# Patient Record
Sex: Female | Born: 1985 | Race: Black or African American | Hispanic: No | Marital: Single | Smoking: Former smoker
Health system: Southern US, Community
[De-identification: ages and names within clinical notes are randomized; demographics above are authoritative.]

## PROBLEM LIST (undated history)

## (undated) ENCOUNTER — Inpatient Hospital Stay (HOSPITAL_COMMUNITY): Payer: Self-pay

## (undated) DIAGNOSIS — D649 Anemia, unspecified: Secondary | ICD-10-CM

## (undated) DIAGNOSIS — Z87891 Personal history of nicotine dependence: Secondary | ICD-10-CM

## (undated) DIAGNOSIS — N189 Chronic kidney disease, unspecified: Secondary | ICD-10-CM

## (undated) DIAGNOSIS — O24419 Gestational diabetes mellitus in pregnancy, unspecified control: Secondary | ICD-10-CM

## (undated) DIAGNOSIS — E282 Polycystic ovarian syndrome: Secondary | ICD-10-CM

## (undated) HISTORY — PX: NO PAST SURGERIES: SHX2092

## (undated) HISTORY — PX: DILATION AND CURETTAGE OF UTERUS: SHX78

---

## 2005-11-14 ENCOUNTER — Emergency Department (HOSPITAL_COMMUNITY): Admission: EM | Admit: 2005-11-14 | Discharge: 2005-11-15 | Payer: Self-pay | Admitting: Emergency Medicine

## 2006-05-05 ENCOUNTER — Emergency Department (HOSPITAL_COMMUNITY): Admission: EM | Admit: 2006-05-05 | Discharge: 2006-05-05 | Payer: Self-pay | Admitting: Emergency Medicine

## 2007-08-28 ENCOUNTER — Emergency Department (HOSPITAL_COMMUNITY): Admission: EM | Admit: 2007-08-28 | Discharge: 2007-08-28 | Payer: Self-pay | Admitting: Emergency Medicine

## 2008-07-29 ENCOUNTER — Encounter: Admission: RE | Admit: 2008-07-29 | Discharge: 2008-07-29 | Payer: Self-pay | Admitting: Family Medicine

## 2009-05-20 ENCOUNTER — Emergency Department (HOSPITAL_COMMUNITY): Admission: EM | Admit: 2009-05-20 | Discharge: 2009-05-20 | Payer: Self-pay | Admitting: Emergency Medicine

## 2010-03-26 ENCOUNTER — Encounter: Payer: Self-pay | Admitting: Family Medicine

## 2010-05-11 ENCOUNTER — Emergency Department (HOSPITAL_COMMUNITY)
Admission: EM | Admit: 2010-05-11 | Discharge: 2010-05-11 | Disposition: A | Discharge status: Home / Self Care | Payer: Medicaid Other | Attending: Emergency Medicine | Admitting: Emergency Medicine

## 2010-05-11 DIAGNOSIS — N898 Other specified noninflammatory disorders of vagina: Secondary | ICD-10-CM | POA: Insufficient documentation

## 2010-05-11 DIAGNOSIS — R109 Unspecified abdominal pain: Secondary | ICD-10-CM | POA: Insufficient documentation

## 2010-05-11 LAB — CBC
MCH: 18.1 pg — ABNORMAL LOW (ref 26.0–34.0)
MCHC: 28.1 g/dL — ABNORMAL LOW (ref 30.0–36.0)
MCV: 64.3 fL — ABNORMAL LOW (ref 78.0–100.0)
Platelets: 241 10*3/uL (ref 150–400)
RBC: 4.7 MIL/uL (ref 3.87–5.11)
RDW: 22 % — ABNORMAL HIGH (ref 11.5–15.5)

## 2010-05-11 LAB — URINALYSIS, ROUTINE W REFLEX MICROSCOPIC
Bilirubin Urine: NEGATIVE
Ketones, ur: NEGATIVE mg/dL
Leukocytes, UA: NEGATIVE
Nitrite: NEGATIVE
Protein, ur: NEGATIVE mg/dL
Urobilinogen, UA: 0.2 mg/dL (ref 0.0–1.0)

## 2010-05-11 LAB — WET PREP, GENITAL
Clue Cells Wet Prep HPF POC: NONE SEEN
Trich, Wet Prep: NONE SEEN
WBC, Wet Prep HPF POC: NONE SEEN
Yeast Wet Prep HPF POC: NONE SEEN

## 2010-05-11 LAB — URINE MICROSCOPIC-ADD ON

## 2010-05-15 LAB — GC/CHLAMYDIA PROBE AMP, GENITAL

## 2010-05-27 LAB — URINE MICROSCOPIC-ADD ON

## 2010-05-27 LAB — URINALYSIS, ROUTINE W REFLEX MICROSCOPIC
Bilirubin Urine: NEGATIVE
Glucose, UA: NEGATIVE mg/dL
Protein, ur: 100 mg/dL — AB
Urobilinogen, UA: 1 mg/dL (ref 0.0–1.0)

## 2010-05-30 IMAGING — CR DG CHEST 2V
2 series · 2 of 2 positions shown · non-contrast
Comparison: None.

CLINICAL DATA: 22-year-old female with cough and fever.

CHEST - 2 VIEW

[w chest pa]
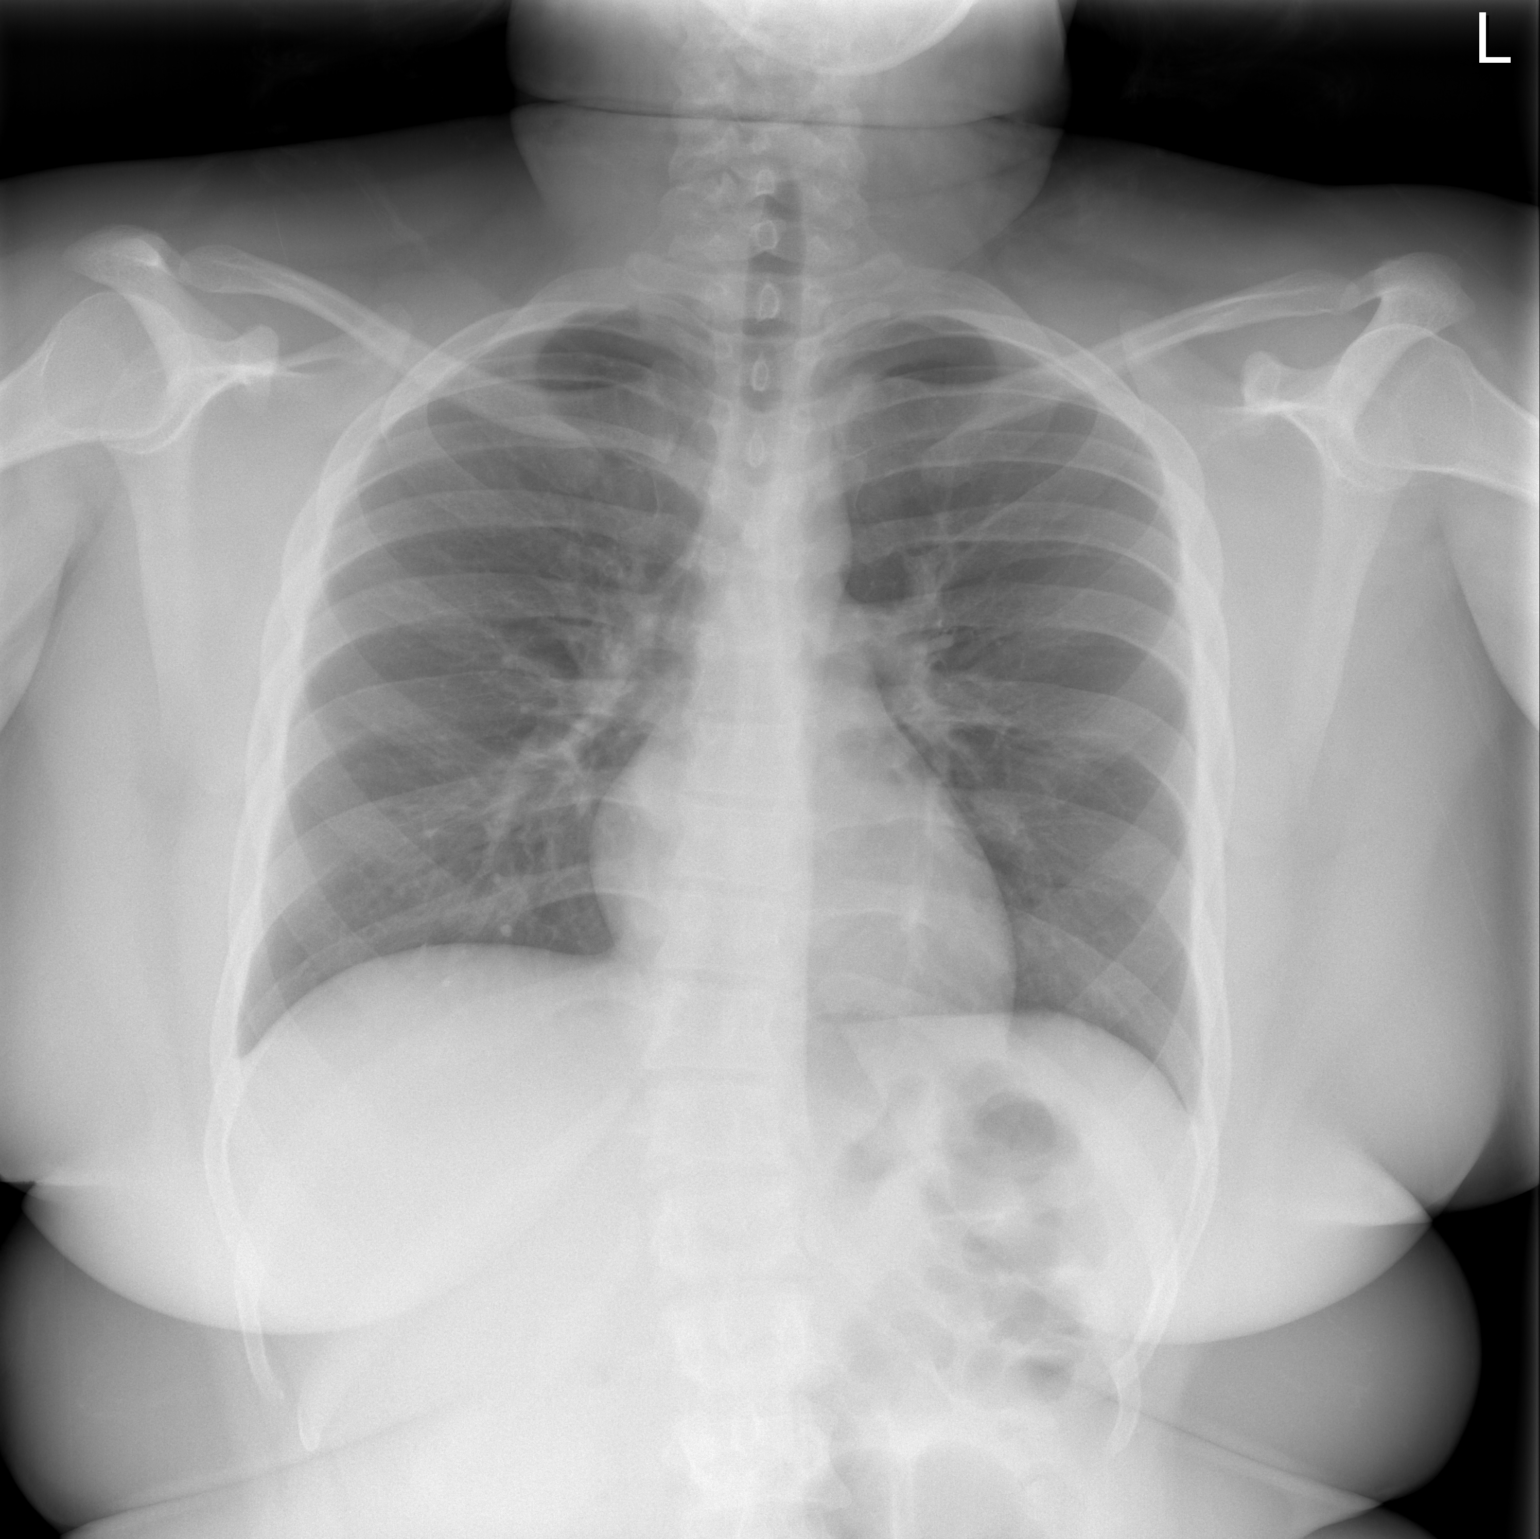

[w chest lat]
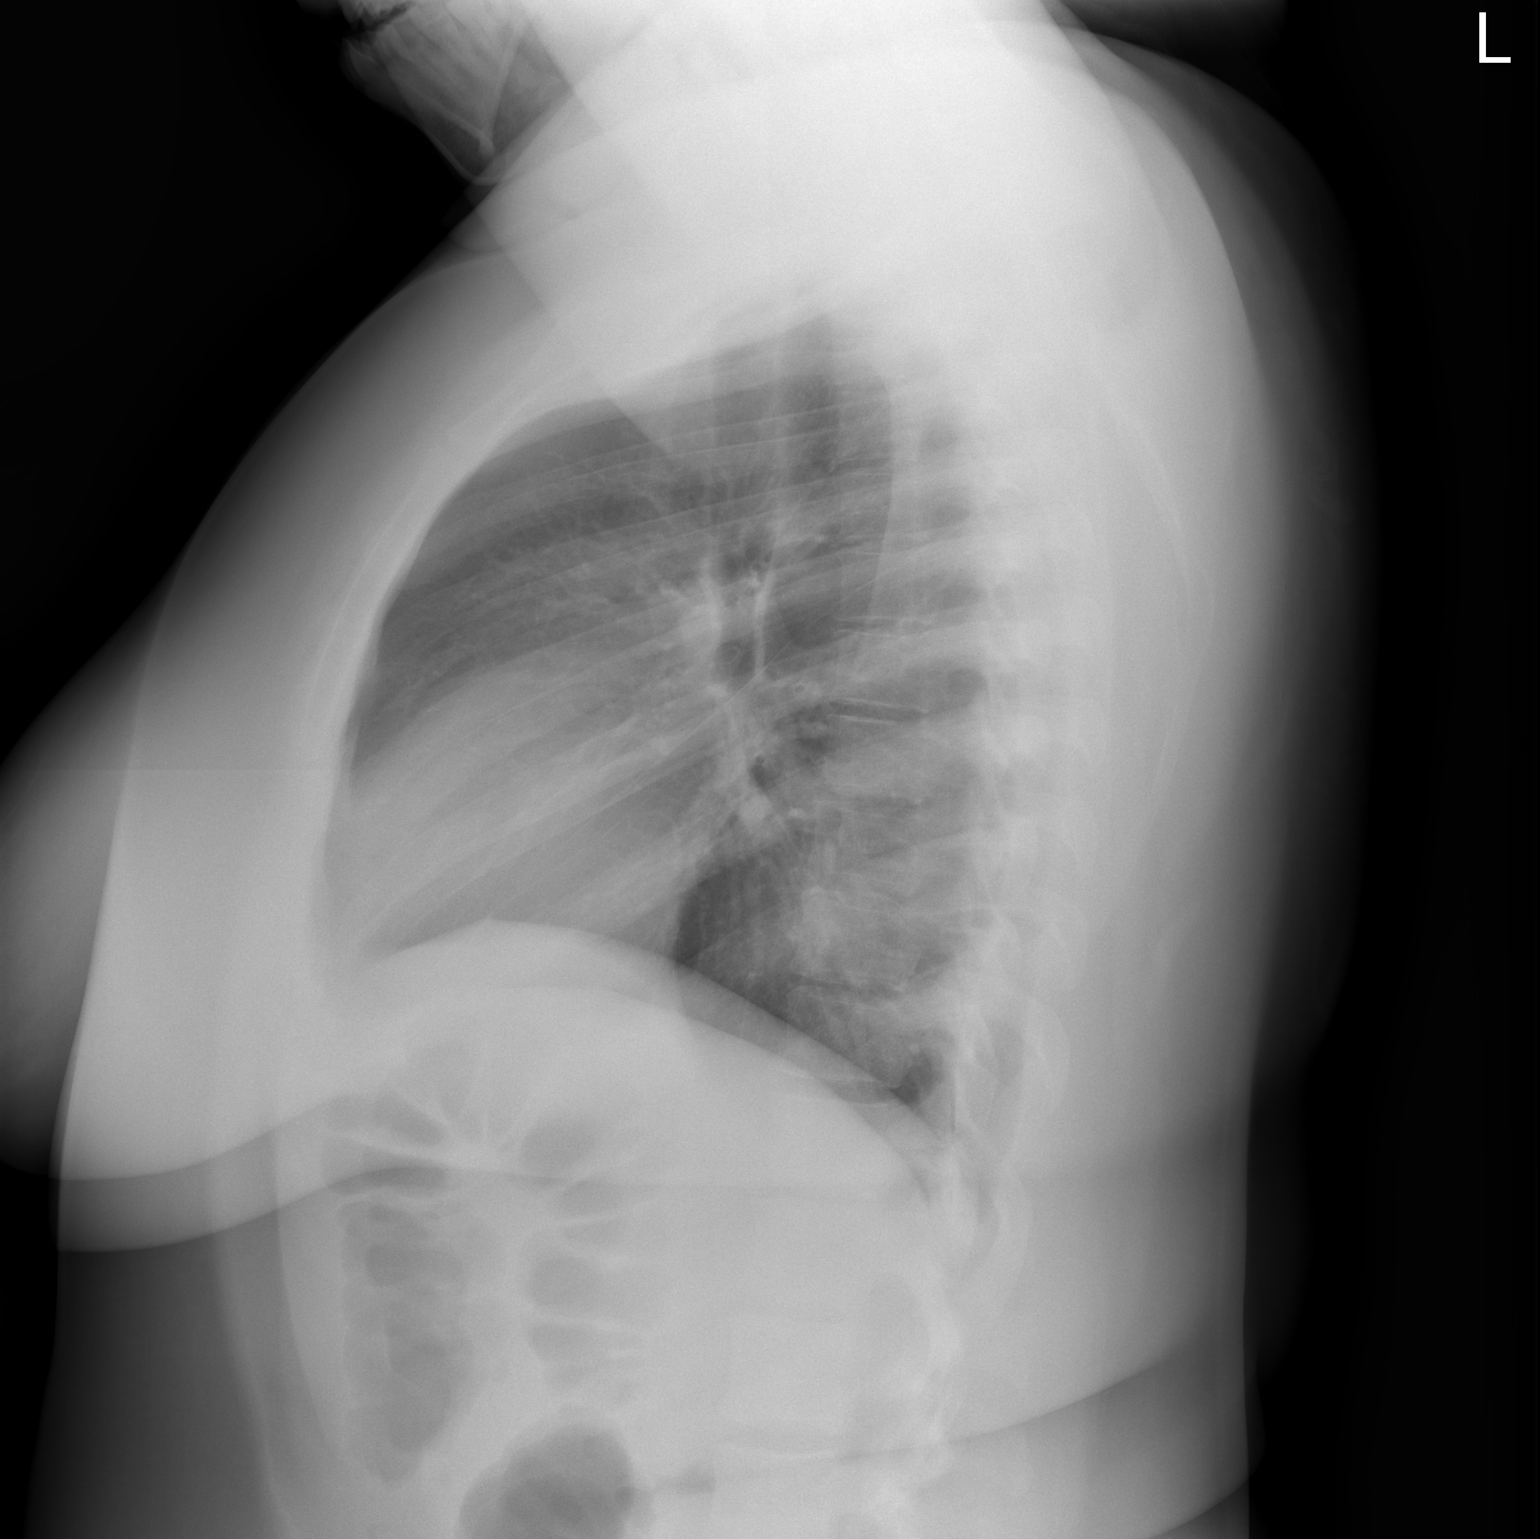

[2 of 2 positions shown; findings below may reference images not displayed]

FINDINGS: Cardiac size and mediastinal contour are normal.  No
pneumothorax, pulmonary edema, pleural effusion or focal airspace
opacity.  Tracheal air column within normal limits on both views.
No osseous abnormality identified.
IMPRESSION: No acute cardiopulmonary abnormality.

## 2010-11-29 LAB — URINALYSIS, ROUTINE W REFLEX MICROSCOPIC
Ketones, ur: NEGATIVE
Nitrite: NEGATIVE
Protein, ur: 30 — AB
pH: 6

## 2010-11-29 LAB — URINE MICROSCOPIC-ADD ON

## 2010-11-29 LAB — POCT PREGNANCY, URINE

## 2012-11-04 LAB — CYTOLOGY - PAP: Pap: NEGATIVE

## 2014-03-04 DIAGNOSIS — E282 Polycystic ovarian syndrome: Secondary | ICD-10-CM

## 2014-03-04 HISTORY — DX: Polycystic ovarian syndrome: E28.2

## 2014-10-19 ENCOUNTER — Encounter (HOSPITAL_COMMUNITY): Payer: Self-pay | Admitting: *Deleted

## 2014-10-19 ENCOUNTER — Inpatient Hospital Stay (HOSPITAL_COMMUNITY): Payer: No Typology Code available for payment source

## 2014-10-19 ENCOUNTER — Inpatient Hospital Stay (HOSPITAL_COMMUNITY)
Admission: AD | Admit: 2014-10-19 | Discharge: 2014-10-19 | Disposition: A | Discharge status: Home / Self Care | Payer: No Typology Code available for payment source | Source: Ambulatory Visit | Attending: Family Medicine | Admitting: Family Medicine

## 2014-10-19 DIAGNOSIS — O26841 Uterine size-date discrepancy, first trimester: Secondary | ICD-10-CM

## 2014-10-19 DIAGNOSIS — O26891 Other specified pregnancy related conditions, first trimester: Secondary | ICD-10-CM | POA: Diagnosis not present

## 2014-10-19 DIAGNOSIS — N831 Corpus luteum cyst of ovary, unspecified side: Secondary | ICD-10-CM

## 2014-10-19 DIAGNOSIS — B9689 Other specified bacterial agents as the cause of diseases classified elsewhere: Secondary | ICD-10-CM

## 2014-10-19 DIAGNOSIS — R109 Unspecified abdominal pain: Secondary | ICD-10-CM

## 2014-10-19 DIAGNOSIS — O26899 Other specified pregnancy related conditions, unspecified trimester: Secondary | ICD-10-CM

## 2014-10-19 DIAGNOSIS — N926 Irregular menstruation, unspecified: Secondary | ICD-10-CM

## 2014-10-19 DIAGNOSIS — Z87891 Personal history of nicotine dependence: Secondary | ICD-10-CM | POA: Diagnosis not present

## 2014-10-19 DIAGNOSIS — N760 Acute vaginitis: Secondary | ICD-10-CM

## 2014-10-19 HISTORY — DX: Chronic kidney disease, unspecified: N18.9

## 2014-10-19 HISTORY — DX: Polycystic ovarian syndrome: E28.2

## 2014-10-19 HISTORY — DX: Anemia, unspecified: D64.9

## 2014-10-19 LAB — CBC
HCT: 37.6 % (ref 36.0–46.0)
Hemoglobin: 12.1 g/dL (ref 12.0–15.0)
MCH: 27 pg (ref 26.0–34.0)
MCHC: 32.2 g/dL (ref 30.0–36.0)
MCV: 83.9 fL (ref 78.0–100.0)
PLATELETS: 162 10*3/uL (ref 150–400)
RBC: 4.48 MIL/uL (ref 3.87–5.11)
RDW: 15.6 % — AB (ref 11.5–15.5)
WBC: 9.2 10*3/uL (ref 4.0–10.5)

## 2014-10-19 LAB — OB RESULTS CONSOLE HEPATITIS B SURFACE ANTIGEN: Hepatitis B Surface Ag: NEGATIVE

## 2014-10-19 LAB — URINALYSIS, ROUTINE W REFLEX MICROSCOPIC
Bilirubin Urine: NEGATIVE
GLUCOSE, UA: NEGATIVE mg/dL
HGB URINE DIPSTICK: NEGATIVE
Ketones, ur: NEGATIVE mg/dL
Nitrite: NEGATIVE
PH: 6 (ref 5.0–8.0)
Protein, ur: NEGATIVE mg/dL
SPECIFIC GRAVITY, URINE: 1.02 (ref 1.005–1.030)
Urobilinogen, UA: 0.2 mg/dL (ref 0.0–1.0)

## 2014-10-19 LAB — WET PREP, GENITAL
TRICH WET PREP: NONE SEEN
Yeast Wet Prep HPF POC: NONE SEEN

## 2014-10-19 LAB — OB RESULTS CONSOLE VARICELLA ZOSTER ANTIBODY, IGG: VARICELLA IGG: IMMUNE

## 2014-10-19 LAB — POCT PREGNANCY, URINE: PREG TEST UR: POSITIVE — AB

## 2014-10-19 LAB — HCG, QUANTITATIVE, PREGNANCY: hCG, Beta Chain, Quant, S: 82054 m[IU]/mL — ABNORMAL HIGH (ref <5)

## 2014-10-19 LAB — URINE MICROSCOPIC-ADD ON

## 2014-10-19 LAB — SICKLE CELL SCREEN: SICKLE CELL SCREEN: NORMAL

## 2014-10-19 LAB — ABO/RH: ABO/RH(D): B POS

## 2014-10-19 MED ORDER — METRONIDAZOLE 500 MG PO TABS: 500.0000 mg | ORAL_TABLET | Freq: Two times a day (BID) | ORAL | Status: DC

## 2014-10-19 NOTE — MAU Note (Signed)
Positive UPT in MAU

## 2014-10-19 NOTE — MAU Note (Signed)
Pt. States early pregnant, did home test last week and positive, no menses since 08/18/14.  No prenatal care yet. Dr. Elmore Guise, [facility] is primary MD. States intermittent sharp pain to upper and lower abd.

## 2014-10-19 NOTE — Discharge Instructions (Signed)
°Abdominal Pain During Pregnancy °Abdominal pain is common in pregnancy. Most of the time, it does not cause harm. There are many causes of abdominal pain. Some causes are more serious than others. Some of the causes of abdominal pain in pregnancy are easily diagnosed. Occasionally, the diagnosis takes time to understand. Other times, the cause is not determined. Abdominal pain can be a sign that something is very wrong with the pregnancy, or the pain may have nothing to do with the pregnancy at all. For this reason, always tell your health care provider if you have any abdominal discomfort. °HOME CARE INSTRUCTIONS  °Monitor your abdominal pain for any changes. The following actions may help to alleviate any discomfort you are experiencing: °· Do not have sexual intercourse or put anything in your vagina until your symptoms go away completely. °· Get plenty of rest until your pain improves. °· Drink clear fluids if you feel nauseous. Avoid solid food as long as you are uncomfortable or nauseous. °· Only take over-the-counter or prescription medicine as directed by your health care provider. °· Keep all follow-up appointments with your health care provider. °SEEK IMMEDIATE MEDICAL CARE IF: °· You are bleeding, leaking fluid, or passing tissue from the vagina. °· You have increasing pain or cramping. °· You have persistent vomiting. °· You have painful or bloody urination. °· You have a fever. °· You notice a decrease in your baby's movements. °· You have extreme weakness or feel faint. °· You have shortness of breath, with or without abdominal pain. °· You develop a severe headache with abdominal pain. °· You have abnormal vaginal discharge with abdominal pain. °· You have persistent diarrhea. °· You have abdominal pain that continues even after rest, or gets worse. °MAKE SURE YOU:  °· Understand these instructions. °· Will watch your condition. °· Will get help right away if you are not doing well or get  worse. °Document Released: 02/18/2005 Document Revised: 12/09/2012 Document Reviewed: 09/17/2012 °ExitCare® Patient Information ©2015 [employer]. This information is not intended to replace advice given to you by your health care provider. Make sure you discuss any questions you have with your health care provider. °Bacterial Vaginosis °Bacterial vaginosis is a vaginal infection that occurs when the normal balance of bacteria in the vagina is disrupted. It results from an overgrowth of certain bacteria. This is the most common vaginal infection in women of childbearing age. Treatment is important to prevent complications, especially in pregnant women, as it can cause a premature delivery. °CAUSES  °Bacterial vaginosis is caused by an increase in harmful bacteria that are normally present in smaller amounts in the vagina. Several different kinds of bacteria can cause bacterial vaginosis. However, the reason that the condition develops is not fully understood. °RISK FACTORS °Certain activities or behaviors can put you at an increased risk of developing bacterial vaginosis, including: °· Having a new sex partner or multiple sex partners. °· Douching. °· Using an intrauterine device (IUD) for contraception. °Women do not get bacterial vaginosis from toilet seats, bedding, swimming pools, or contact with objects around them. °SIGNS AND SYMPTOMS  °Some women with bacterial vaginosis have no signs or symptoms. Common symptoms include: °· Grey vaginal discharge. °· A fishlike odor with discharge, especially after sexual intercourse. °· Itching or burning of the vagina and vulva. °· Burning or pain with urination. °DIAGNOSIS  °Your health care provider will take a medical history and examine the vagina for signs of bacterial vaginosis. A sample of vaginal fluid may   be taken. Your health care provider will look at this sample under a microscope to check for bacteria and abnormal cells. A vaginal pH test may also be done.   °TREATMENT  °Bacterial vaginosis may be treated with antibiotic medicines. These may be given in the form of a pill or a vaginal cream. A second round of antibiotics may be prescribed if the condition comes back after treatment.  °HOME CARE INSTRUCTIONS  °· Only take over-the-counter or prescription medicines as directed by your health care provider. °· If antibiotic medicine was prescribed, take it as directed. Make sure you finish it even if you start to feel better. °· Do not have sex until treatment is completed. °· Tell all sexual partners that you have a vaginal infection. They should see their health care provider and be treated if they have problems, such as a mild rash or itching. °· Practice safe sex by using condoms and only having one sex partner. °SEEK MEDICAL CARE IF:  °· Your symptoms are not improving after 3 days of treatment. °· You have increased discharge or pain. °· You have a fever. °MAKE SURE YOU:  °· Understand these instructions. °· Will watch your condition. °· Will get help right away if you are not doing well or get worse. °FOR MORE INFORMATION  °Centers for Disease Control and Prevention, Division of STD Prevention: www.cdc.gov/std °American Sexual Health Association (ASHA): www.ashastd.org  °Document Released: 02/18/2005 Document Revised: 12/09/2012 Document Reviewed: 09/30/2012 °ExitCare® Patient Information ©2015 [employer]. This information is not intended to replace advice given to you by your health care provider. Make sure you discuss any questions you have with your health care provider. ° °

## 2014-10-19 NOTE — MAU Provider Note (Signed)
Chief Complaint: Abdominal Pain   First Provider Initiated Contact with Patient 10/19/14 1406     SUBJECTIVE HPI: Victoria Christensen is a 29 y.o. G1P0 at [redacted]w[redacted]d by irreg LMP who presents to Maternity Admissions reporting Low abd cramping and occasional sharp upper abd pains x 2 weeks. Pos home UPT. No other testing this pregnancy.   Location: suprapubic, RUQ  Quality: sharp, occasionally crampy Severity: 7/10 on pain scale Duration: 2 weeks Context: None Timing: Random Modifying factors: None. Hasn't tried any meds or diet changes Associated signs and symptoms: Negative for fever, chill, N/V/D/C, vaginal bleeding.   Past Medical History  Diagnosis Date  . PCOS (polycystic ovarian syndrome) 2016  . Anemia   . Chronic kidney disease     pyelonephritis   OB History  Gravida Para Term Preterm AB SAB TAB Ectopic Multiple Living  1             # Outcome Date GA Lbr Len/2nd Weight Sex Delivery Anes PTL Lv  1 Current              Past Surgical History  Procedure Laterality Date  . No past surgeries     Social History   Social History  . Marital Status: Single    Spouse Name: N/A  . Number of Children: N/A  . Years of Education: N/A   Occupational History  . Not on file.   Social History Main Topics  . Smoking status: Former Games developer  . Smokeless tobacco: Never Used  . Alcohol Use: No     Comment: socially when not pregnant   . Drug Use: No  . Sexual Activity: Yes    Birth Control/ Protection: None   Other Topics Concern  . Not on file   Social History Narrative  . No narrative on file   No current facility-administered medications on file prior to encounter.   No current outpatient prescriptions on file prior to encounter.   No Known Allergies  I have reviewed the past Medical Hx, Surgical Hx, Social Hx, Allergies and Medications.   Review of Systems  Constitutional: Negative for fever, chills and appetite change.  Gastrointestinal: Positive for abdominal  pain. Negative for nausea, vomiting, diarrhea, constipation, blood in stool and abdominal distention.  Genitourinary: Negative for dysuria, urgency, frequency, hematuria, flank pain, vaginal bleeding, vaginal discharge and pelvic pain.  Musculoskeletal: Negative for back pain.    OBJECTIVE Patient Vitals for the past 24 hrs:  BP Temp Temp src Pulse Resp SpO2 Height Weight  10/19/14 1238 141/76 mmHg 98.5 F (36.9 C) Oral 73 20 100 % 5\' 3"  (1.6 m) 249 lb (112.946 kg)   Constitutional: Well-developed, well-nourished, obese female in no acute distress.  Cardiovascular: normal rate Respiratory: normal rate and effort.  GI: Abd soft, non-tender. Pos BS x 4 MS: Extremities nontender, no edema, normal ROM Neurologic: Alert and oriented x 4.  GU: Neg CVAT.  SPECULUM EXAM: NEFG, moderate amount of thin, white, malodorous discharge, no blood noted, cervix closed; UTA uterine size due to body habitus, no adnexal tenderness or masses, No CMT.  LAB RESULTS Results for orders placed or performed during the hospital encounter of 10/19/14 (from the past 24 hour(s))  Urinalysis, Routine w reflex microscopic (not at [facility])     Status: Abnormal   Collection Time: 10/19/14 12:45 PM  Result Value Ref Range   Color, Urine YELLOW YELLOW   APPearance CLEAR CLEAR   Specific Gravity, Urine 1.020 1.005 - 1.030   pH  6.0 5.0 - 8.0   Glucose, UA NEGATIVE NEGATIVE mg/dL   Hgb urine dipstick NEGATIVE NEGATIVE   Bilirubin Urine NEGATIVE NEGATIVE   Ketones, ur NEGATIVE NEGATIVE mg/dL   Protein, ur NEGATIVE NEGATIVE mg/dL   Urobilinogen, UA 0.2 0.0 - 1.0 mg/dL   Nitrite NEGATIVE NEGATIVE   Leukocytes, UA TRACE (A) NEGATIVE  Urine microscopic-add on     Status: Abnormal   Collection Time: 10/19/14 12:45 PM  Result Value Ref Range   Squamous Epithelial / LPF FEW (A) RARE   WBC, UA 0-2 <3 WBC/hpf   Bacteria, UA FEW (A) RARE  Pregnancy, urine POC     Status: Abnormal   Collection Time: 10/19/14  1:42 PM  Result  Value Ref Range   Preg Test, Ur POSITIVE (A) NEGATIVE  ABO/Rh     Status: None (Preliminary result)   Collection Time: 10/19/14  2:19 PM  Result Value Ref Range   ABO/RH(D) B POS   hCG, quantitative, pregnancy     Status: Abnormal   Collection Time: 10/19/14  2:23 PM  Result Value Ref Range   hCG, Beta Chain, Quant, S 82054 (H) <5 mIU/mL  CBC     Status: Abnormal   Collection Time: 10/19/14  2:23 PM  Result Value Ref Range   WBC 9.2 4.0 - 10.5 K/uL   RBC 4.48 3.87 - 5.11 MIL/uL   Hemoglobin 12.1 12.0 - 15.0 g/dL   HCT 16.1 09.6 - 04.5 %   MCV 83.9 78.0 - 100.0 fL   MCH 27.0 26.0 - 34.0 pg   MCHC 32.2 30.0 - 36.0 g/dL   RDW 40.9 (H) 81.1 - 91.4 %   Platelets 162 150 - 400 K/uL  Wet prep, genital     Status: Abnormal   Collection Time: 10/19/14  2:30 PM  Result Value Ref Range   Yeast Wet Prep HPF POC NONE SEEN NONE SEEN   Trich, Wet Prep NONE SEEN NONE SEEN   Clue Cells Wet Prep HPF POC FEW (A) NONE SEEN   WBC, Wet Prep HPF POC FEW (A) NONE SEEN    IMAGING US Ob Comp Less 14 Wks  10/19/2014   CLINICAL DATA:  29 year old pregnant female with a reported history of polycystic ovarian syndrome presents with upper and lower abdominal pain.  LMP 08/14/2014, EDC by LMP: 05/21/2015, projecting to an expected gestational age of [redacted] weeks 3 days  EXAM: OBSTETRIC <14 WK [place] AND TRANSVAGINAL OB US  TECHNIQUE: Both transabdominal and transvaginal ultrasound examinations were performed for complete evaluation of the gestation as well as the maternal uterus, adnexal regions, and pelvic cul-de-sac. Transvaginal technique was performed to assess early pregnancy.  COMPARISON:  None.  FINDINGS: Intrauterine gestational sac: Single intrauterine gestational sac is normal in size, position and shape. No perigestational bleed.  Yolk sac:  Present and normal.  Embryo:  Present.  Cardiac Activity: Regular rate and rhythm.  Heart Rate: 158  bpm  CRL:  15.8  mm   8 w   0 d                  [place] EDC: 05/31/2015   Maternal uterus/adnexae: Maternal right ovary measures 3.9 x 3.3 x 3.2 cm and contains a 2.2 cm corpus luteum. Maternal left ovary measures 3.2 x 1.4 x 1.3 cm. No suspicious ovarian or adnexal masses. No abnormal free fluid in the pelvis.  IMPRESSION: 1. Single living intrauterine gestation at 8 weeks 0 days by crown-rump length, which lags the  expected gestational age of [redacted] weeks 3 days by provided menstrual dating. 2. Normal embryonic cardiac activity. No first-trimester gestational abnormality. 3. No maternal ovarian or adnexal abnormality.   Electronically Signed   By: Delbert Phenix M.D.   On: 10/19/2014 15:11   US Ob Transvaginal  10/19/2014   CLINICAL DATA:  29 year old pregnant female with a reported history of polycystic ovarian syndrome presents with upper and lower abdominal pain.  LMP 08/14/2014, EDC by LMP: 05/21/2015, projecting to an expected gestational age of [redacted] weeks 3 days  EXAM: OBSTETRIC <14 WK [place] AND TRANSVAGINAL OB US  TECHNIQUE: Both transabdominal and transvaginal ultrasound examinations were performed for complete evaluation of the gestation as well as the maternal uterus, adnexal regions, and pelvic cul-de-sac. Transvaginal technique was performed to assess early pregnancy.  COMPARISON:  None.  FINDINGS: Intrauterine gestational sac: Single intrauterine gestational sac is normal in size, position and shape. No perigestational bleed.  Yolk sac:  Present and normal.  Embryo:  Present.  Cardiac Activity: Regular rate and rhythm.  Heart Rate: 158  bpm  CRL:  15.8  mm   8 w   0 d                  [place] EDC: 05/31/2015  Maternal uterus/adnexae: Maternal right ovary measures 3.9 x 3.3 x 3.2 cm and contains a 2.2 cm corpus luteum. Maternal left ovary measures 3.2 x 1.4 x 1.3 cm. No suspicious ovarian or adnexal masses. No abnormal free fluid in the pelvis.  IMPRESSION: 1. Single living intrauterine gestation at 8 weeks 0 days by crown-rump length, which lags the expected gestational age of [redacted] weeks 3  days by provided menstrual dating. 2. Normal embryonic cardiac activity. No first-trimester gestational abnormality. 3. No maternal ovarian or adnexal abnormality.   Electronically Signed   By: Delbert Phenix M.D.   On: 10/19/2014 15:11    MAU COURSE UA, [place], CBC, quant, Wet prep, GC/Chlamydia  MDM 28 year-old female w/ irreg menstrual cycles presents w/ 2 week Hx lower abd cramping and mild, intermittent sharp upper abd pain--none now. Work-up for ectopic pregnancy shows normal IUP, S<D. Upper abd pain resolved spontaneously. May be reflux or gas-related, but does not appear to be emergent.    ASSESSMENT 1. Corpus luteum cyst   2. Abdominal pain affecting pregnancy, antepartum   3. Irregular menstrual cycle   4. Uterine size date discrepancy pregnancy, first trimester   5. BV (bacterial vaginosis)     PLAN Discharge home in stable condition. Changed EDD based on today's [place]. First trimester precautions. Pregnancy verification letter List of providers     Follow-up Information    Follow up with Obstetrician of your choice.   Why:  Start prenatal care      Follow up with THE [facility] [place] [facility]ERN[facility] ADMISSIONS.   Why:  As needed in emergencies   Contact information:   [address] [id number] mc [place] [address] [phone]       Medication List    TAKE these medications        metFORMIN 500 MG tablet  Commonly known as:  GLUCOPHAGE  Take 500 mg by mouth 2 (two) times daily with a meal.     metroNIDAZOLE 500 MG tablet  Commonly known as:  FLAGYL  Take 1 tablet (500 mg total) by mouth 2 (two) times daily.     prenatal multivitamin Tabs tablet  Take 1 tablet by mouth daily at 12 noon.  Tanc[place], CNM 10/19/2014  4:00 PM

## 2014-10-20 LAB — HIV ANTIBODY (ROUTINE TESTING W REFLEX): HIV Screen 4th Generation wRfx: NONREACTIVE

## 2014-10-20 LAB — GC/CHLAMYDIA PROBE AMP (~~LOC~~) NOT AT ARMC
CHLAMYDIA, DNA PROBE: NEGATIVE
Neisseria Gonorrhea: NEGATIVE

## 2015-01-09 LAB — OB RESULTS CONSOLE RUBELLA ANTIBODY, IGM: RUBELLA: IMMUNE

## 2015-01-09 LAB — OB RESULTS CONSOLE HEPATITIS B SURFACE ANTIGEN: Hepatitis B Surface Ag: NEGATIVE

## 2015-01-09 LAB — OB RESULTS CONSOLE VARICELLA ZOSTER ANTIBODY, IGG: Varicella: IMMUNE

## 2015-01-09 LAB — OB RESULTS CONSOLE ANTIBODY SCREEN: Antibody Screen: NEGATIVE

## 2015-01-10 DIAGNOSIS — O24419 Gestational diabetes mellitus in pregnancy, unspecified control: Secondary | ICD-10-CM

## 2015-01-10 DIAGNOSIS — O0990 Supervision of high risk pregnancy, unspecified, unspecified trimester: Secondary | ICD-10-CM

## 2015-01-16 ENCOUNTER — Encounter: Payer: Medicaid Other | Attending: Obstetrics and Gynecology | Admitting: *Deleted

## 2015-01-16 ENCOUNTER — Ambulatory Visit: Payer: Medicaid Other | Admitting: *Deleted

## 2015-01-16 VITALS — Ht 62.0 in | Wt 249.0 lb

## 2015-01-16 DIAGNOSIS — O24112 Pre-existing diabetes mellitus, type 2, in pregnancy, second trimester: Secondary | ICD-10-CM | POA: Diagnosis present

## 2015-01-16 DIAGNOSIS — Z713 Dietary counseling and surveillance: Secondary | ICD-10-CM | POA: Diagnosis not present

## 2015-01-16 MED ORDER — GLUCOSE BLOOD VI STRP: ORAL_STRIP | Status: DC

## 2015-01-16 MED ORDER — ACCU-CHEK FASTCLIX LANCETS MISC: 1.0000 | Freq: Four times a day (QID) | Status: DC

## 2015-01-16 NOTE — Progress Notes (Signed)
  Patient was seen on 01/16/15 for Gestational Diabetes self-management . The following learning objectives were met by the patient :   States the definition of Gestational Diabetes  States why dietary management is important in controlling blood glucose  Describes the effects of carbohydrates on blood glucose levels  Demonstrates ability to create a balanced meal plan  Demonstrates carbohydrate counting   States when to check blood glucose levels  Demonstrates proper blood glucose monitoring techniques  States the effect of stress and exercise on blood glucose levels  States the importance of limiting caffeine and abstaining from alcohol and smoking  Plan:  Consider  increasing your activity level by walking daily as tolerated Begin checking BG before breakfast and 2 hours after first bit of breakfast, lunch and dinner after  as directed by MD  Take medication  as directed by MD  Blood glucose monitor given: Accu-chek [employer] connect Lot # [id number] Exp: 08/02/15 Blood glucose reading: 236 1hpp orange juice and sausage biscuit. See will meet with Nutrition and I will see her next week for review of glucose readings.  Patient instructed to monitor glucose levels: FBS: 60 - <90 2 hour: <120  Patient received the following handouts:  Nutrition Diabetes and Pregnancy  Carbohydrate Counting List  Meal Planning worksheet  Patient will be seen for follow-up as needed.

## 2015-01-16 NOTE — Progress Notes (Signed)
Nutrition note: GDM diet education Pt has h/o obesity. Pt was taking Metformin prior to pregnancy but stopped because she was told her BS were wnl but then her GTT came back elevated so she is to follow the DM diet. Pt has gained 9# @ 2323w5d, which is wnl. Pt reports eating 3 meals & 0-1 snack/d. Pt is taking a PNV. Pt reports no N&V but has heartburn occ. Pt reports no walking or physical activity. Pt received verbal & written education on DM diet during pregnancy. Discussed BF tip of the week (BF benefits for mom). Encouraged walking ~30 mins/d. Discussed wt gain goals of 11-20# or 0.5#/wk. Pt agrees to follow DM diet with 3 meals & 1-3 snacks/d with proper CHO/ protein combination. Pt has WIC & is unsure about BF. F/u in 2-4 wks Blondell RevealLaura Shaquavia Whisonant, MS, RD, LDN, [facility]CLC

## 2015-01-18 ENCOUNTER — Encounter: Payer: Self-pay | Admitting: *Deleted

## 2015-01-18 DIAGNOSIS — O24419 Gestational diabetes mellitus in pregnancy, unspecified control: Secondary | ICD-10-CM | POA: Insufficient documentation

## 2015-01-18 DIAGNOSIS — O0990 Supervision of high risk pregnancy, unspecified, unspecified trimester: Secondary | ICD-10-CM | POA: Insufficient documentation

## 2015-01-19 LAB — OB RESULTS CONSOLE GC/CHLAMYDIA
CHLAMYDIA, DNA PROBE: NEGATIVE
Gonorrhea: NEGATIVE

## 2015-01-20 ENCOUNTER — Telehealth: Payer: Self-pay | Admitting: General Practice

## 2015-01-20 DIAGNOSIS — O280 Abnormal hematological finding on antenatal screening of mother: Secondary | ICD-10-CM

## 2015-01-20 NOTE — Telephone Encounter (Signed)
Patient needs genetic counseling appt for elevated quad screen. Ordered referral to MFM and scheduled for 11/29 @ 2pm. Called patient and informed her of appt. Patient states she works out of town and her schedule changes often and doesn't know if that will work for her. Provided MFM phone number to patient to call and reschedule. Patient verbalized understanding and states she will not be able to make her appt on Monday because she works. Told patient someone from the front office will call her to reschedule the appt. Patient verbalized understanding and had no other questions

## 2015-01-23 ENCOUNTER — Encounter: Payer: No Typology Code available for payment source | Admitting: Obstetrics and Gynecology

## 2015-01-31 ENCOUNTER — Ambulatory Visit (HOSPITAL_COMMUNITY): Payer: Medicaid Other

## 2015-02-02 ENCOUNTER — Ambulatory Visit (HOSPITAL_COMMUNITY)
Admission: RE | Admit: 2015-02-02 | Discharge: 2015-02-02 | Disposition: A | Discharge status: Home / Self Care | Payer: Medicaid Other | Source: Ambulatory Visit | Attending: Obstetrics and Gynecology | Admitting: Obstetrics and Gynecology

## 2015-02-02 DIAGNOSIS — O0992 Supervision of high risk pregnancy, unspecified, second trimester: Secondary | ICD-10-CM

## 2015-02-02 DIAGNOSIS — O283 Abnormal ultrasonic finding on antenatal screening of mother: Secondary | ICD-10-CM | POA: Diagnosis not present

## 2015-02-02 DIAGNOSIS — O24419 Gestational diabetes mellitus in pregnancy, unspecified control: Secondary | ICD-10-CM

## 2015-02-02 DIAGNOSIS — Z3A23 23 weeks gestation of pregnancy: Secondary | ICD-10-CM | POA: Diagnosis not present

## 2015-02-02 DIAGNOSIS — O281 Abnormal biochemical finding on antenatal screening of mother: Secondary | ICD-10-CM

## 2015-02-02 DIAGNOSIS — O351XX0 Maternal care for (suspected) chromosomal abnormality in fetus, not applicable or unspecified: Secondary | ICD-10-CM | POA: Insufficient documentation

## 2015-02-02 DIAGNOSIS — O280 Abnormal hematological finding on antenatal screening of mother: Secondary | ICD-10-CM

## 2015-02-02 DIAGNOSIS — Z36 Encounter for antenatal screening of mother: Secondary | ICD-10-CM | POA: Diagnosis not present

## 2015-02-02 DIAGNOSIS — O99212 Obesity complicating pregnancy, second trimester: Secondary | ICD-10-CM | POA: Diagnosis not present

## 2015-02-02 DIAGNOSIS — Z315 Encounter for genetic counseling: Secondary | ICD-10-CM | POA: Diagnosis not present

## 2015-02-02 NOTE — Addendum Note (Signed)
Encounter addended by: Dessie ComaKaren Louise Ech Thomos Domine on: 02/02/2015  5:11 PM<BR>     Documentation filed: Notes Section

## 2015-02-02 NOTE — Progress Notes (Addendum)
Genetic Counseling  High-Risk Gestation Note  Appointment Date:  02/02/2015 Referred By: Catalina Antiguaonstant, Peggy, MD Date of Birth:  [birth year] Partner:  Morrell Riddlehristopher Hockaday   Pregnancy History: G1P0000 Estimated Date of Delivery: 05/31/15 Estimated Gestational Age: 7767w1d Attending: Particia NearingMartha Decker, MD   Ms. Victoria Christensen was seen for genetic counseling because of an increased risk for fetal Down syndrome based [facility]  In Summary:  1 in 107 Down syndrome risk from Quad screen  Elevated DIA (2.13 MoM), associated with increased risk for adverse pregnancy outcomes  Detailed ultrasound performed today  Patient declined NIPS and amniocentesis  Follow-up ultrasound scheduled for 03/10/15   She was counseled regarding the Quad screen result and the associated 1 in 107 risk for fetal Down syndrome.  We reviewed chromosomes, nondisjunction, and the common features and variable prognosis of Down syndrome.  In addition, we reviewed the screen adjusted reduction in risks for trisomy 18 and ONTDs.  We also discussed other explanations for a screen positive result including: a gestational dating error, differences in maternal metabolism, and normal variation. She understands that this screening is not diagnostic for Down syndrome but provides a risk assessment. We specifically discussed that the level of one of the proteins analyzed on the screen, DIA, was very high (2.13 MoM).  This has been associated with an increased risk for growth restriction or poor pregnancy outcome later in pregnancy; therefore, we would recommend a follow up ultrasound for fetal growth in the third trimester.  We reviewed available screening options including noninvasive prenatal screening (NIPS)/cell free DNA (cfDNA) testing, and detailed ultrasound.  She was counseled that screening tests are used to modify a patient's a priori risk for aneuploidy, typically based on age.  This estimate provides a pregnancy specific risk assessment. We reviewed the benefits and limitations of each option. Specifically, we discussed the conditions for which each test screens, the detection rates, and false positive rates of each. She was also counseled regarding diagnostic testing via amniocentesis. We reviewed the approximate 1 in 300-500 risk for complications for amniocentesis, including spontaneous pregnancy loss or preterm labor.   After consideration of all the options, she declined NIPS and amniocentesis.  The patient also expressed interest in having a detailed ultrasound, which was performed today. The ultrasound report will be sent under separate cover. There were no visualized fetal anomalies or markers suggestive of aneuploidy.  She understands that screening tests cannot rule out all birth defects or genetic syndromes. The patient was advised of this limitation and states she still does not want additional testing at this time.   Ms. Victoria Christensen was provided with written information regarding sickle cell anemia (SCA) including the carrier frequency and incidence in the African-American population, the availability of carrier testing and prenatal diagnosis if indicated.  In addition, we discussed that hemoglobinopathies are routinely screened for as part of the [place] newborn screening panel.  Hemoglobin electrophoresis is available to the patient to screen for hemoglobin variants if not previously performed and if desired by the patient.   Both family histories were reviewed and found to be contributory for possible dwarfism for the father of the pregnancy's relatives. His maternal grandmother reportedly is 4'7, and two of her children are also less than 5'4. The father of the pregnancy, his mother, and his siblings were reportedly of typical stature. The relatives with short stature reportedly do not have associated or additional health problems. We discussed that there are many forms of  skeletal dysplasias and that the family history is most consistent with an autosomal dominant form of short stature. Given the reported family history, recurrence risk for the current pregnancy would likely be low. Without further information regarding the provided family history, an accurate genetic risk cannot be calculated. Further genetic counseling is warranted if more information is obtained.  Ms. Victoria Christensen denied exposure to environmental toxins or chemical agents. She denied the use of alcohol, tobacco or street drugs. She denied significant viral illnesses during the course of her pregnancy. Her medical and surgical histories were noncontributory.   I counseled Ms. Victoria Christensen for approximately 40 minutes regarding the above risks and available options.   Quinn Plowman, MS,  Certified Genetic Counselor 02/02/2015

## 2015-02-06 ENCOUNTER — Telehealth: Payer: Self-pay | Admitting: Obstetrics and Gynecology

## 2015-02-06 ENCOUNTER — Encounter: Payer: Medicaid Other | Admitting: Obstetrics and Gynecology

## 2015-02-06 NOTE — Telephone Encounter (Signed)
Called patient to inform her of missed appointment, and left message to call us back to reschedule.

## 2015-03-10 ENCOUNTER — Ambulatory Visit (HOSPITAL_COMMUNITY)
Admission: RE | Admit: 2015-03-10 | Discharge: 2015-03-10 | Disposition: A | Discharge status: Home / Self Care | Payer: Medicaid Other | Source: Ambulatory Visit | Attending: Obstetrics and Gynecology | Admitting: Obstetrics and Gynecology

## 2015-03-10 ENCOUNTER — Encounter (HOSPITAL_COMMUNITY): Payer: Self-pay

## 2015-03-10 DIAGNOSIS — O99213 Obesity complicating pregnancy, third trimester: Secondary | ICD-10-CM | POA: Insufficient documentation

## 2015-03-10 DIAGNOSIS — O24419 Gestational diabetes mellitus in pregnancy, unspecified control: Secondary | ICD-10-CM

## 2015-03-10 DIAGNOSIS — Z36 Encounter for antenatal screening of mother: Secondary | ICD-10-CM | POA: Diagnosis not present

## 2015-03-10 DIAGNOSIS — O281 Abnormal biochemical finding on antenatal screening of mother: Secondary | ICD-10-CM

## 2015-03-10 DIAGNOSIS — Z3A28 28 weeks gestation of pregnancy: Secondary | ICD-10-CM | POA: Insufficient documentation

## 2015-03-10 DIAGNOSIS — O0992 Supervision of high risk pregnancy, unspecified, second trimester: Secondary | ICD-10-CM

## 2015-03-10 DIAGNOSIS — O283 Abnormal ultrasonic finding on antenatal screening of mother: Secondary | ICD-10-CM | POA: Insufficient documentation

## 2015-04-10 ENCOUNTER — Encounter: Payer: Self-pay | Admitting: Family Medicine

## 2015-04-10 ENCOUNTER — Other Ambulatory Visit: Payer: Self-pay | Admitting: Family Medicine

## 2015-04-10 ENCOUNTER — Ambulatory Visit (INDEPENDENT_AMBULATORY_CARE_PROVIDER_SITE_OTHER): Payer: Medicaid Other | Admitting: Family Medicine

## 2015-04-10 VITALS — BP 125/69 | HR 87 | Temp 98.0°F | Wt 240.8 lb

## 2015-04-10 DIAGNOSIS — O280 Abnormal hematological finding on antenatal screening of mother: Secondary | ICD-10-CM

## 2015-04-10 DIAGNOSIS — O24112 Pre-existing diabetes mellitus, type 2, in pregnancy, second trimester: Secondary | ICD-10-CM

## 2015-04-10 DIAGNOSIS — O0933 Supervision of pregnancy with insufficient antenatal care, third trimester: Secondary | ICD-10-CM

## 2015-04-10 DIAGNOSIS — O24419 Gestational diabetes mellitus in pregnancy, unspecified control: Secondary | ICD-10-CM | POA: Diagnosis not present

## 2015-04-10 DIAGNOSIS — O24113 Pre-existing diabetes mellitus, type 2, in pregnancy, third trimester: Secondary | ICD-10-CM

## 2015-04-10 DIAGNOSIS — O289 Unspecified abnormal findings on antenatal screening of mother: Secondary | ICD-10-CM

## 2015-04-10 DIAGNOSIS — O0992 Supervision of high risk pregnancy, unspecified, second trimester: Secondary | ICD-10-CM

## 2015-04-10 LAB — POCT URINALYSIS DIP (DEVICE)
Bilirubin Urine: NEGATIVE
GLUCOSE, UA: 100 mg/dL — AB
NITRITE: NEGATIVE
PROTEIN: NEGATIVE mg/dL
SPECIFIC GRAVITY, URINE: 1.015 (ref 1.005–1.030)
UROBILINOGEN UA: 0.2 mg/dL (ref 0.0–1.0)
pH: 7 (ref 5.0–8.0)

## 2015-04-10 LAB — HEMOGLOBIN A1C
HEMOGLOBIN A1C: 7.8 % — AB (ref <5.7)
MEAN PLASMA GLUCOSE: 177 mg/dL — AB (ref <117)

## 2015-04-10 LAB — GLUCOSE, CAPILLARY: GLUCOSE-CAPILLARY: 162 mg/dL — AB (ref 65–99)

## 2015-04-10 LAB — COMPREHENSIVE METABOLIC PANEL
ALBUMIN: 3.2 g/dL — AB (ref 3.6–5.1)
ALT: 10 U/L (ref 6–29)
AST: 13 U/L (ref 10–30)
Alkaline Phosphatase: 172 U/L — ABNORMAL HIGH (ref 33–115)
BILIRUBIN TOTAL: 0.3 mg/dL (ref 0.2–1.2)
BUN: 3 mg/dL — ABNORMAL LOW (ref 7–25)
CALCIUM: 9.9 mg/dL (ref 8.6–10.2)
CO2: 23 mmol/L (ref 20–31)
Chloride: 104 mmol/L (ref 98–110)
Creat: 0.62 mg/dL (ref 0.50–1.10)
Glucose, Bld: 133 mg/dL — ABNORMAL HIGH (ref 65–99)
POTASSIUM: 3.9 mmol/L (ref 3.5–5.3)
Sodium: 136 mmol/L (ref 135–146)
Total Protein: 6.3 g/dL (ref 6.1–8.1)

## 2015-04-10 LAB — TSH: TSH: 1.22 m[IU]/L

## 2015-04-10 MED ORDER — ASPIRIN EC 81 MG PO TBEC: 81.0000 mg | DELAYED_RELEASE_TABLET | Freq: Every day | ORAL | Status: DC

## 2015-04-10 MED ORDER — ACCU-CHEK FASTCLIX LANCETS MISC: 1.0000 | Freq: Four times a day (QID) | Status: DC

## 2015-04-10 NOTE — Progress Notes (Signed)
Nutrition note: GDM diet f/u  Pt was here 01/16/15 to see [name]- diabetes educator & myself but has not been checking her BS due to not having lancets.  Pt has gained 0.8# @ [redacted]w[redacted]d, which is < expected. Pt reports she has made some diet changes since we talked in November but was unable to remember most of what we discussed about the GDM diet. Pt reports eating 2 meals & 3 snacks/d (pt reports not eating breakfast most days). Pt is taking a PNV. Pt reports no N&V but some heartburn. NKFA. Pt reports walking for ~30 mins 3x/wk. Pt received verbal & written review of GDM diet. Pt completed "Worksheet for Diabetes with Pregnancy." Pt agrees to start checking her BS and follow the GDM diet. F/u in 2-4 wks Blondell Reveal, MS, RD, LDN, [facility]

## 2015-04-10 NOTE — Progress Notes (Signed)
Breastfeeding tip of the week reviewed. 

## 2015-04-10 NOTE — Progress Notes (Signed)
Subjective:  Victoria Christensen is a 30 y.o. G1P0000 at 32w5dbeing seen today for Initial prenatal care.  She is currently monitored for the following issues for this high-risk pregnancy and has Supervision of high risk pregnancy, antepartum; Gestational diabetes mellitus (GDM), antepartum; and Abnormal maternal serum screening test on her problem list.  Patient diagnosed with diabetes after a 20week 1hr and 3 hr GTT.  Patient was referred here, although did not come to see the provider.  She did attend her appt with Nutrition and diabetic nurse educator.  She has not been checking her blood sugars because she didn't have lancets.  She does have an increased DSR of 1:108.  She met with the genetic counselor and declined further testing.  Patient reports no complaints.  Contractions: Irritability. Vag. Bleeding: None.  Movement: Present. Denies leaking of fluid.   The following portions of the patient's history were reviewed and updated as appropriate: allergies, current medications, past family history, past medical history, past social history, past surgical history and problem list. Problem list updated.  Objective:   Filed Vitals:   04/10/15 0824  BP: 125/69  Pulse: 87  Temp: 98 F (36.7 C)  Weight: 240 lb 12.8 oz (109.226 kg)    Fetal Status: Fetal Heart Rate (bpm): 134   Movement: Present     General:  Alert, oriented and cooperative. Patient is in no acute distress.  Skin: Skin is warm and dry. No rash noted.   Cardiovascular: Normal heart rate noted  Respiratory: Normal respiratory effort, no problems with respiration noted  Abdomen: Soft, gravid, appropriate for gestational age. Pain/Pressure: Present     Pelvic: Vag. Bleeding: None     Cervical exam deferred        Extremities: Normal range of motion.  Edema: Trace  Mental Status: Normal mood and affect. Normal behavior. Normal judgment and thought content.   Urinalysis:      Assessment and Plan:  Pregnancy: G1P0000 at  352w5d1. Supervision of high risk pregnancy, antepartum, second trimester FHT normal. - TSH - Hemoglobin A1c - Obstetric panel - Comprehensive metabolic panel - [place] OB FOLLOW UP; Future  2. Gestational diabetes mellitus (GDM), antepartum, gestational diabetes method of control unspecified Restart metformin.  Start twice weekly testing. Growth [place] risks of diabetes in pregnancy: increased risk of shoulder dystocia, macrosomia, cesarean section, complicated delivery including lacerations, and increased risk of stillbirth.   - TSH - Hemoglobin A1c - Obstetric panel - Comprehensive metabolic panel - [place] OB FOLLOW UP; Future  3. Abnormal maternal serum screening test Has seen genetic counselor.  4. Type 2 diabetes mellitus complicating pregnancy, antepartum, second trimester - ACCU-CHEK FASTCLIX LANCETS MISC; 1 each by Percutaneous route 4 (four) times daily. T2V4QVomplicated by pregnancy O24.119 for testing 4 times daily  Dispense: 100 each; Refill: 12  Preterm labor symptoms and general obstetric precautions including but not limited to vaginal bleeding, contractions, leaking of fluid and fetal movement were reviewed in detail with the patient. Please refer to After Visit Summary for other counseling recommendations.  Return in about 4 days (around 04/14/2015) for 2x/wk as scheduled.   JaTruett MainlandDO

## 2015-04-10 NOTE — Progress Notes (Deleted)
Nutrition note: GDM diet f/u Pt was here 01/16/15 to see [name]- diabetes educator & myself but has not been checking her BS due to not having lancets. Pt

## 2015-04-10 NOTE — Patient Instructions (Signed)

## 2015-04-11 LAB — OBSTETRIC PANEL
Antibody Screen: NEGATIVE
BASOS ABS: 0 10*3/uL (ref 0.0–0.1)
BASOS PCT: 0 % (ref 0–1)
EOS ABS: 0.1 10*3/uL (ref 0.0–0.7)
EOS PCT: 1 % (ref 0–5)
HEMATOCRIT: 34.5 % — AB (ref 36.0–46.0)
HEMOGLOBIN: 11 g/dL — AB (ref 12.0–15.0)
HEP B S AG: NEGATIVE
LYMPHS ABS: 1.5 10*3/uL (ref 0.7–4.0)
Lymphocytes Relative: 21 % (ref 12–46)
MCH: 26.6 pg (ref 26.0–34.0)
MCHC: 31.9 g/dL (ref 30.0–36.0)
MCV: 83.3 fL (ref 78.0–100.0)
MONO ABS: 0.5 10*3/uL (ref 0.1–1.0)
MONOS PCT: 7 % (ref 3–12)
MPV: 11.1 fL (ref 8.6–12.4)
Neutro Abs: 5 10*3/uL (ref 1.7–7.7)
Neutrophils Relative %: 71 % (ref 43–77)
Platelets: 119 10*3/uL — ABNORMAL LOW (ref 150–400)
RBC: 4.14 MIL/uL (ref 3.87–5.11)
RDW: 14 % (ref 11.5–15.5)
Rh Type: POSITIVE
Rubella: 1.63 Index — ABNORMAL HIGH (ref <0.90)
WBC: 7 10*3/uL (ref 4.0–10.5)

## 2015-04-14 ENCOUNTER — Ambulatory Visit (INDEPENDENT_AMBULATORY_CARE_PROVIDER_SITE_OTHER): Payer: Medicaid Other | Admitting: *Deleted

## 2015-04-14 VITALS — BP 124/69 | HR 95

## 2015-04-14 DIAGNOSIS — O24419 Gestational diabetes mellitus in pregnancy, unspecified control: Secondary | ICD-10-CM | POA: Diagnosis not present

## 2015-04-14 DIAGNOSIS — Z36 Encounter for antenatal screening of mother: Secondary | ICD-10-CM | POA: Diagnosis not present

## 2015-04-14 NOTE — Progress Notes (Signed)
NST reactive.

## 2015-04-17 ENCOUNTER — Encounter: Payer: Self-pay | Admitting: Obstetrics and Gynecology

## 2015-04-17 ENCOUNTER — Ambulatory Visit (INDEPENDENT_AMBULATORY_CARE_PROVIDER_SITE_OTHER): Payer: Medicaid Other | Admitting: Obstetrics and Gynecology

## 2015-04-17 ENCOUNTER — Other Ambulatory Visit: Payer: Self-pay | Admitting: Obstetrics and Gynecology

## 2015-04-17 ENCOUNTER — Ambulatory Visit (HOSPITAL_COMMUNITY)
Admission: RE | Admit: 2015-04-17 | Discharge: 2015-04-17 | Disposition: A | Discharge status: Home / Self Care | Payer: Medicaid Other | Source: Ambulatory Visit | Attending: Obstetrics and Gynecology | Admitting: Obstetrics and Gynecology

## 2015-04-17 VITALS — BP 115/68 | HR 83 | Wt 240.5 lb

## 2015-04-17 DIAGNOSIS — O0993 Supervision of high risk pregnancy, unspecified, third trimester: Secondary | ICD-10-CM

## 2015-04-17 DIAGNOSIS — O24415 Gestational diabetes mellitus in pregnancy, controlled by oral hypoglycemic drugs: Secondary | ICD-10-CM | POA: Diagnosis not present

## 2015-04-17 DIAGNOSIS — O283 Abnormal ultrasonic finding on antenatal screening of mother: Secondary | ICD-10-CM | POA: Insufficient documentation

## 2015-04-17 DIAGNOSIS — Z3A33 33 weeks gestation of pregnancy: Secondary | ICD-10-CM | POA: Diagnosis not present

## 2015-04-17 DIAGNOSIS — O99213 Obesity complicating pregnancy, third trimester: Secondary | ICD-10-CM | POA: Insufficient documentation

## 2015-04-17 DIAGNOSIS — O24419 Gestational diabetes mellitus in pregnancy, unspecified control: Secondary | ICD-10-CM

## 2015-04-17 DIAGNOSIS — O09899 Supervision of other high risk pregnancies, unspecified trimester: Secondary | ICD-10-CM

## 2015-04-17 DIAGNOSIS — O288 Other abnormal findings on antenatal screening of mother: Secondary | ICD-10-CM

## 2015-04-17 DIAGNOSIS — O289 Unspecified abnormal findings on antenatal screening of mother: Secondary | ICD-10-CM | POA: Diagnosis not present

## 2015-04-17 DIAGNOSIS — O0933 Supervision of pregnancy with insufficient antenatal care, third trimester: Secondary | ICD-10-CM

## 2015-04-17 DIAGNOSIS — O281 Abnormal biochemical finding on antenatal screening of mother: Secondary | ICD-10-CM

## 2015-04-17 DIAGNOSIS — O280 Abnormal hematological finding on antenatal screening of mother: Secondary | ICD-10-CM

## 2015-04-17 LAB — POCT URINALYSIS DIP (DEVICE)
BILIRUBIN URINE: NEGATIVE
Glucose, UA: NEGATIVE mg/dL
NITRITE: NEGATIVE
PH: 6 (ref 5.0–8.0)
PROTEIN: NEGATIVE mg/dL
Specific Gravity, Urine: 1.01 (ref 1.005–1.030)
UROBILINOGEN UA: 0.2 mg/dL (ref 0.0–1.0)

## 2015-04-17 MED ORDER — GLYBURIDE 2.5 MG PO TABS: 2.5000 mg | ORAL_TABLET | Freq: Two times a day (BID) | ORAL | Status: DC

## 2015-04-17 MED ORDER — TETANUS-DIPHTH-ACELL PERTUSSIS 5-2.5-18.5 LF-MCG/0.5 IM SUSP
0.5000 mL | Freq: Once | INTRAMUSCULAR | Status: AC
  Administered 2015-04-17: 0.5 mL via INTRAMUSCULAR

## 2015-04-17 NOTE — Progress Notes (Signed)
Subjective:  Victoria Christensen is a 30 y.o. G1P0000 at [redacted]w[redacted]d being seen today for ongoing prenatal care.  She is currently monitored for the following issues for this high-risk pregnancy and has Supervision of high risk pregnancy, antepartum; Gestational diabetes mellitus (GDM), antepartum; Abnormal maternal serum screening test; and Insufficient prenatal care in third trimester on her problem list.  Patient reports no complaints.  Contractions: Irregular. Vag. Bleeding: None.  Movement: Present. Denies leaking of fluid.   The following portions of the patient's history were reviewed and updated as appropriate: allergies, current medications, past family history, past medical history, past social history, past surgical history and problem list. Problem list updated.  Objective:   Filed Vitals:   04/17/15 0758  BP: 115/68  Pulse: 83  Weight: 240 lb 8 oz (109.09 kg)    Fetal Status: Fetal Heart Rate (bpm): NST   Movement: Present     General:  Alert, oriented and cooperative. Patient is in no acute distress.  Skin: Skin is warm and dry. No rash noted.   Cardiovascular: Normal heart rate noted  Respiratory: Normal respiratory effort, no problems with respiration noted  Abdomen: Soft, gravid, appropriate for gestational age. Pain/Pressure: Present     Pelvic: Vag. Bleeding: None     Cervical exam deferred        Extremities: Normal range of motion.  Edema: None  Mental Status: Normal mood and affect. Normal behavior. Normal judgment and thought content.   Urinalysis: Urine Protein: Negative Urine Glucose: Negative  Assessment and Plan:  Pregnancy: G1P0000 at [redacted]w[redacted]d  1. Gestational diabetes mellitus (GDM), antepartum, gestational diabetes method of control unspecified Patient has been taking metformin 1000 mg daily CBGs reviewed and all out of range. Fasting 130-140 and pp 180's-200 Will start glyburide 2.5 mg BID Follow up growth ultrasound on 2/17 NST reviewed and non reactive.  Patient sent to radiology for BPP  2. Supervision of high risk pregnancy, antepartum, third trimester   3. Insufficient prenatal care in third trimester   4. Abnormal maternal serum screening test   Preterm labor symptoms and general obstetric precautions including but not limited to vaginal bleeding, contractions, leaking of fluid and fetal movement were reviewed in detail with the patient. Please refer to After Visit Summary for other counseling recommendations.  Return in about 4 days (around 04/21/2015) for 2x/wk as scheduled.   Catalina Antigua, MD

## 2015-04-17 NOTE — Patient Instructions (Signed)
Contraception Choices Contraception (birth control) is the use of any methods or devices to prevent pregnancy. Below are some methods to help avoid pregnancy. HORMONAL METHODS   Contraceptive implant. This is a thin, plastic tube containing progesterone hormone. It does not contain estrogen hormone. Your health care provider inserts the tube in the inner part of the upper arm. The tube can remain in place for up to 3 years. After 3 years, the implant must be removed. The implant prevents the ovaries from releasing an egg (ovulation), thickens the cervical mucus to prevent sperm from entering the uterus, and thins the lining of the inside of the uterus.  Progesterone-only injections. These injections are given every 3 months by your health care provider to prevent pregnancy. This synthetic progesterone hormone stops the ovaries from releasing eggs. It also thickens cervical mucus and changes the uterine lining. This makes it harder for sperm to survive in the uterus.  Birth control pills. These pills contain estrogen and progesterone hormone. They work by preventing the ovaries from releasing eggs (ovulation). They also cause the cervical mucus to thicken, preventing the sperm from entering the uterus. Birth control pills are prescribed by a health care provider.Birth control pills can also be used to treat heavy periods.  Minipill. This type of birth control pill contains only the progesterone hormone. They are taken every day of each month and must be prescribed by your health care provider.  Birth control patch. The patch contains hormones similar to those in birth control pills. It must be changed once a week and is prescribed by a health care provider.  Vaginal ring. The ring contains hormones similar to those in birth control pills. It is left in the vagina for 3 weeks, removed for 1 week, and then a new one is put back in place. The patient must be comfortable inserting and removing the ring  from the vagina.A health care provider's prescription is necessary.  Emergency contraception. Emergency contraceptives prevent pregnancy after unprotected sexual intercourse. This pill can be taken right after sex or up to 5 days after unprotected sex. It is most effective the sooner you take the pills after having sexual intercourse. Most emergency contraceptive pills are available without a prescription. Check with your pharmacist. Do not use emergency contraception as your only form of birth control. BARRIER METHODS   Female condom. This is a thin sheath (latex or rubber) that is worn over the penis during sexual intercourse. It can be used with spermicide to increase effectiveness.  Female condom. This is a soft, loose-fitting sheath that is put into the vagina before sexual intercourse.  Diaphragm. This is a soft, latex, dome-shaped barrier that must be fitted by a health care provider. It is inserted into the vagina, along with a spermicidal jelly. It is inserted before intercourse. The diaphragm should be left in the vagina for 6 to 8 hours after intercourse.  Cervical cap. This is a round, soft, latex or plastic cup that fits over the cervix and must be fitted by a health care provider. The cap can be left in place for up to 48 hours after intercourse.  Sponge. This is a soft, circular piece of polyurethane foam. The sponge has spermicide in it. It is inserted into the vagina after wetting it and before sexual intercourse.  Spermicides. These are chemicals that kill or block sperm from entering the cervix and uterus. They come in the form of creams, jellies, suppositories, foam, or tablets. They do not require a   prescription. They are inserted into the vagina with an applicator before having sexual intercourse. The process must be repeated every time you have sexual intercourse. INTRAUTERINE CONTRACEPTION  Intrauterine device (IUD). This is a T-shaped device that is put in a woman's uterus  during a menstrual period to prevent pregnancy. There are 2 types:  Copper IUD. This type of IUD is wrapped in copper wire and is placed inside the uterus. Copper makes the uterus and fallopian tubes produce a fluid that kills sperm. It can stay in place for 10 years.  Hormone IUD. This type of IUD contains the hormone progestin (synthetic progesterone). The hormone thickens the cervical mucus and prevents sperm from entering the uterus, and it also thins the uterine lining to prevent implantation of a fertilized egg. The hormone can weaken or kill the sperm that get into the uterus. It can stay in place for 3-5 years, depending on which type of IUD is used. PERMANENT METHODS OF CONTRACEPTION  Female tubal ligation. This is when the woman's fallopian tubes are surgically sealed, tied, or blocked to prevent the egg from traveling to the uterus.  Hysteroscopic sterilization. This involves placing a small coil or insert into each fallopian tube. Your doctor uses a technique called hysteroscopy to do the procedure. The device causes scar tissue to form. This results in permanent blockage of the fallopian tubes, so the sperm cannot fertilize the egg. It takes about 3 months after the procedure for the tubes to become blocked. You must use another form of birth control for these 3 months.  Female sterilization. This is when the female has the tubes that carry sperm tied off (vasectomy).This blocks sperm from entering the vagina during sexual intercourse. After the procedure, the man can still ejaculate fluid (semen). NATURAL PLANNING METHODS  Natural family planning. This is not having sexual intercourse or using a barrier method (condom, diaphragm, cervical cap) on days the woman could become pregnant.  Calendar method. This is keeping track of the length of each menstrual cycle and identifying when you are fertile.  Ovulation method. This is avoiding sexual intercourse during ovulation.  Symptothermal  method. This is avoiding sexual intercourse during ovulation, using a thermometer and ovulation symptoms.  Post-ovulation method. This is timing sexual intercourse after you have ovulated. Regardless of which type or method of contraception you choose, it is important that you use condoms to protect against the transmission of sexually transmitted infections (STIs). Talk with your health care provider about which form of contraception is most appropriate for you.   This information is not intended to replace advice given to you by your health care provider. Make sure you discuss any questions you have with your health care provider.   Document Released: 02/18/2005 Document Revised: 02/23/2013 Document Reviewed: 08/13/2012 Elsevier Interactive Patient Education 2016 [employer]  

## 2015-04-17 NOTE — Progress Notes (Signed)
BPP scheduled for now. Escorted patient to MFM dept.

## 2015-04-17 NOTE — Progress Notes (Signed)
Pt reports that the Metformin gives her diarrhea.  [place] for growth on 2/17.  Breastfeeding tip of the week reviewed.

## 2015-04-21 ENCOUNTER — Encounter (HOSPITAL_COMMUNITY): Payer: Self-pay

## 2015-04-21 ENCOUNTER — Ambulatory Visit (INDEPENDENT_AMBULATORY_CARE_PROVIDER_SITE_OTHER): Payer: Medicaid Other | Admitting: *Deleted

## 2015-04-21 ENCOUNTER — Ambulatory Visit (HOSPITAL_COMMUNITY)
Admission: RE | Admit: 2015-04-21 | Discharge: 2015-04-21 | Disposition: A | Discharge status: Home / Self Care | Payer: Medicaid Other | Source: Ambulatory Visit | Attending: Family Medicine | Admitting: Family Medicine

## 2015-04-21 VITALS — BP 128/67 | HR 99

## 2015-04-21 DIAGNOSIS — O24419 Gestational diabetes mellitus in pregnancy, unspecified control: Secondary | ICD-10-CM

## 2015-04-21 DIAGNOSIS — O288 Other abnormal findings on antenatal screening of mother: Secondary | ICD-10-CM

## 2015-04-21 DIAGNOSIS — O0992 Supervision of high risk pregnancy, unspecified, second trimester: Secondary | ICD-10-CM

## 2015-04-21 DIAGNOSIS — O283 Abnormal ultrasonic finding on antenatal screening of mother: Secondary | ICD-10-CM | POA: Diagnosis not present

## 2015-04-21 DIAGNOSIS — O24415 Gestational diabetes mellitus in pregnancy, controlled by oral hypoglycemic drugs: Secondary | ICD-10-CM | POA: Insufficient documentation

## 2015-04-21 DIAGNOSIS — O99213 Obesity complicating pregnancy, third trimester: Secondary | ICD-10-CM | POA: Insufficient documentation

## 2015-04-21 DIAGNOSIS — Z3A34 34 weeks gestation of pregnancy: Secondary | ICD-10-CM | POA: Insufficient documentation

## 2015-04-21 HISTORY — DX: Gestational diabetes mellitus in pregnancy, unspecified control: O24.419

## 2015-04-21 NOTE — Progress Notes (Signed)
US for growth today - BPP added due to NR NST 

## 2015-04-21 NOTE — Progress Notes (Addendum)
NST performed today was reviewed and was found to be nonreactive. Ultrasound BPP was 8/8.  Continue recommended antenatal testing and prenatal care.

## 2015-04-22 ENCOUNTER — Encounter: Payer: Self-pay | Admitting: *Deleted

## 2015-04-22 DIAGNOSIS — O0990 Supervision of high risk pregnancy, unspecified, unspecified trimester: Secondary | ICD-10-CM

## 2015-04-24 ENCOUNTER — Ambulatory Visit (INDEPENDENT_AMBULATORY_CARE_PROVIDER_SITE_OTHER): Payer: Medicaid Other | Admitting: Family Medicine

## 2015-04-24 VITALS — BP 127/60 | HR 91 | Wt 240.1 lb

## 2015-04-24 DIAGNOSIS — O26899 Other specified pregnancy related conditions, unspecified trimester: Secondary | ICD-10-CM | POA: Diagnosis not present

## 2015-04-24 DIAGNOSIS — O9989 Other specified diseases and conditions complicating pregnancy, childbirth and the puerperium: Secondary | ICD-10-CM

## 2015-04-24 DIAGNOSIS — O321XX0 Maternal care for breech presentation, not applicable or unspecified: Secondary | ICD-10-CM | POA: Diagnosis not present

## 2015-04-24 DIAGNOSIS — R319 Hematuria, unspecified: Secondary | ICD-10-CM | POA: Diagnosis not present

## 2015-04-24 DIAGNOSIS — O289 Unspecified abnormal findings on antenatal screening of mother: Secondary | ICD-10-CM

## 2015-04-24 DIAGNOSIS — O0933 Supervision of pregnancy with insufficient antenatal care, third trimester: Secondary | ICD-10-CM | POA: Diagnosis not present

## 2015-04-24 DIAGNOSIS — O24419 Gestational diabetes mellitus in pregnancy, unspecified control: Secondary | ICD-10-CM | POA: Diagnosis present

## 2015-04-24 DIAGNOSIS — O0993 Supervision of high risk pregnancy, unspecified, third trimester: Secondary | ICD-10-CM

## 2015-04-24 DIAGNOSIS — O280 Abnormal hematological finding on antenatal screening of mother: Secondary | ICD-10-CM

## 2015-04-24 DIAGNOSIS — M549 Dorsalgia, unspecified: Secondary | ICD-10-CM | POA: Diagnosis not present

## 2015-04-24 LAB — POCT URINALYSIS DIP (DEVICE)
BILIRUBIN URINE: NEGATIVE
GLUCOSE, UA: NEGATIVE mg/dL
KETONES UR: NEGATIVE mg/dL
NITRITE: NEGATIVE
PH: 6.5 (ref 5.0–8.0)
Protein, ur: 30 mg/dL — AB
Specific Gravity, Urine: 1.01 (ref 1.005–1.030)
Urobilinogen, UA: 0.2 mg/dL (ref 0.0–1.0)

## 2015-04-24 MED ORDER — CYCLOBENZAPRINE HCL 5 MG PO TABS: 5.0000 mg | ORAL_TABLET | Freq: Three times a day (TID) | ORAL | Status: DC | PRN

## 2015-04-24 MED ORDER — GLYBURIDE 5 MG PO TABS: 5.0000 mg | ORAL_TABLET | Freq: Two times a day (BID) | ORAL | Status: DC

## 2015-04-24 NOTE — Patient Instructions (Addendum)
[email or web address]      Breastfeeding Deciding to breastfeed is one of the best choices you can make for you and your baby. A change in hormones during pregnancy causes your breast tissue to grow and increases the number and size of your milk ducts. These hormones also allow proteins, sugars, and fats from your blood supply to make breast milk in your milk-producing glands. Hormones prevent breast milk from being released before your baby is born as well as prompt milk flow after birth. Once breastfeeding has begun, thoughts of your baby, as well as his or her sucking or crying, can stimulate the release of milk from your milk-producing glands.  BENEFITS OF BREASTFEEDING For Your Baby  Your first milk (colostrum) helps your baby's digestive system function better.  There are antibodies in your milk that help your baby fight off infections.  Your baby has a lower incidence of asthma, allergies, and sudden infant death syndrome.  The nutrients in breast milk are better for your baby than infant formulas and are designed uniquely for your baby's needs.  Breast milk improves your baby's brain development.  Your baby is less likely to develop other conditions, such as childhood obesity, asthma, or type 2 diabetes mellitus. For You  Breastfeeding helps to create a very special bond between you and your baby.  Breastfeeding is convenient. Breast milk is always available at the correct temperature and costs nothing.  Breastfeeding helps to burn calories and helps you lose the weight gained during pregnancy.  Breastfeeding makes your uterus contract to its prepregnancy size faster and slows bleeding (lochia) after you give birth.   Breastfeeding helps to lower your risk of developing type 2 diabetes mellitus, osteoporosis, and breast or ovarian cancer later in life. SIGNS THAT YOUR BABY IS HUNGRY Early Signs of Hunger  Increased alertness or activity.  Stretching.  Movement of  the head from side to side.  Movement of the head and opening of the mouth when the corner of the mouth or cheek is stroked (rooting).  Increased sucking sounds, smacking lips, cooing, sighing, or squeaking.  Hand-to-mouth movements.  Increased sucking of fingers or hands. Late Signs of Hunger  Fussing.  Intermittent crying. Extreme Signs of Hunger Signs of extreme hunger will require calming and consoling before your baby will be able to breastfeed successfully. Do not wait for the following signs of extreme hunger to occur before you initiate breastfeeding:  Restlessness.  A loud, strong cry.  Screaming. BREASTFEEDING BASICS Breastfeeding Initiation  Find a comfortable place to sit or lie down, with your neck and back well supported.  Place a pillow or rolled up blanket under your baby to bring him or her to the level of your breast (if you are seated). Nursing pillows are specially designed to help support your arms and your baby while you breastfeed.  Make sure that your baby's abdomen is facing your abdomen.  Gently massage your breast. With your fingertips, massage from your chest wall toward your nipple in a circular motion. This encourages milk flow. You may need to continue this action during the feeding if your milk flows slowly.  Support your breast with 4 fingers underneath and your thumb above your nipple. Make sure your fingers are well away from your nipple and your baby's mouth.  Stroke your baby's lips gently with your finger or nipple.  When your baby's mouth is open wide enough, quickly bring your baby to your breast, placing your entire nipple and as much of  the colored area around your nipple (areola) as possible into your baby's mouth.  More areola should be visible above your baby's upper lip than below the lower lip.  Your baby's tongue should be between his or her lower gum and your breast.  Ensure that your baby's mouth is correctly positioned  around your nipple (latched). Your baby's lips should create a seal on your breast and be turned out (everted).  It is common for your baby to suck about 2-3 minutes in order to start the flow of breast milk. Latching Teaching your baby how to latch on to your breast properly is very important. An improper latch can cause nipple pain and decreased milk supply for you and poor weight gain in your baby. Also, if your baby is not latched onto your nipple properly, he or she may swallow some air during feeding. This can make your baby fussy. Burping your baby when you switch breasts during the feeding can help to get rid of the air. However, teaching your baby to latch on properly is still the best way to prevent fussiness from swallowing air while breastfeeding. Signs that your baby has successfully latched on to your nipple:  Silent tugging or silent sucking, without causing you pain.  Swallowing heard between every 3-4 sucks.  Muscle movement above and in front of his or her ears while sucking. Signs that your baby has not successfully latched on to nipple:  Sucking sounds or smacking sounds from your baby while breastfeeding.  Nipple pain. If you think your baby has not latched on correctly, slip your finger into the corner of your baby's mouth to break the suction and place it between your baby's gums. Attempt breastfeeding initiation again. Signs of Successful Breastfeeding Signs from your baby:  A gradual decrease in the number of sucks or complete cessation of sucking.  Falling asleep.  Relaxation of his or her body.  Retention of a small amount of milk in his or her mouth.  Letting go of your breast by himself or herself. Signs from you:  Breasts that have increased in firmness, weight, and size 1-3 hours after feeding.  Breasts that are softer immediately after breastfeeding.  Increased milk volume, as well as a change in milk consistency and color by the fifth day of  breastfeeding.  Nipples that are not sore, cracked, or bleeding. Signs That Your Pecola Leisure is Getting Enough Milk  Wetting at least 3 diapers in a 24-hour period. The urine should be clear and pale yellow by age 257 days.  At least 3 stools in a 24-hour period by age 257 days. The stool should be soft and yellow.  At least 3 stools in a 24-hour period by age 28 days. The stool should be seedy and yellow.  No loss of weight greater than 10% of birth weight during the first 63 days of age.  Average weight gain of 4-7 ounces (113-198 g) per week after age 61 days.  Consistent daily weight gain by age 257 days, without weight loss after the age of 2 weeks. After a feeding, your baby may spit up a small amount. This is common. BREASTFEEDING FREQUENCY AND DURATION Frequent feeding will help you make more milk and can prevent sore nipples and breast engorgement. Breastfeed when you feel the need to reduce the fullness of your breasts or when your baby shows signs of hunger. This is called "breastfeeding on demand." Avoid introducing a pacifier to your baby while you are working to  establish breastfeeding (the first 4-6 weeks after your baby is born). After this time you may choose to use a pacifier. Research has shown that pacifier use during the first year of a baby's life decreases the risk of sudden infant death syndrome (SIDS). Allow your baby to feed on each breast as long as he or she wants. Breastfeed until your baby is finished feeding. When your baby unlatches or falls asleep while feeding from the first breast, offer the second breast. Because newborns are often sleepy in the first few weeks of life, you may need to awaken your baby to get him or her to feed. Breastfeeding times will vary from baby to baby. However, the following rules can serve as a guide to help you ensure that your baby is properly fed:  Newborns (babies 54 weeks of age or younger) may breastfeed every 1-3 hours.  Newborns should not  go longer than 3 hours during the day or 5 hours during the night without breastfeeding.  You should breastfeed your baby a minimum of 8 times in a 24-hour period until you begin to introduce solid foods to your baby at around 70 months of age. BREAST MILK PUMPING Pumping and storing breast milk allows you to ensure that your baby is exclusively fed your breast milk, even at times when you are unable to breastfeed. This is especially important if you are going back to work while you are still breastfeeding or when you are not able to be present during feedings. Your lactation consultant can give you guidelines on how long it is safe to store breast milk. A breast pump is a machine that allows you to pump milk from your breast into a sterile bottle. The pumped breast milk can then be stored in a refrigerator or freezer. Some breast pumps are operated by hand, while others use electricity. Ask your lactation consultant which type will work best for you. Breast pumps can be purchased, but some hospitals and breastfeeding support groups lease breast pumps on a monthly basis. A lactation consultant can teach you how to hand express breast milk, if you prefer not to use a pump. CARING FOR YOUR BREASTS WHILE YOU BREASTFEED Nipples can become dry, cracked, and sore while breastfeeding. The following recommendations can help keep your breasts moisturized and healthy:  Avoid using soap on your nipples.  Wear a supportive bra. Although not required, special nursing bras and tank tops are designed to allow access to your breasts for breastfeeding without taking off your entire bra or top. Avoid wearing underwire-style bras or extremely tight bras.  Air dry your nipples for 3-69minutes after each feeding.  Use only cotton bra pads to absorb leaked breast milk. Leaking of breast milk between feedings is normal.  Use lanolin on your nipples after breastfeeding. Lanolin helps to maintain your skin's normal moisture  barrier. If you use pure lanolin, you do not need to wash it off before feeding your baby again. Pure lanolin is not toxic to your baby. You may also hand express a few drops of breast milk and gently massage that milk into your nipples and allow the milk to air dry. In the first few weeks after giving birth, some women experience extremely full breasts (engorgement). Engorgement can make your breasts feel heavy, warm, and tender to the touch. Engorgement peaks within 3-5 days after you give birth. The following recommendations can help ease engorgement:  Completely empty your breasts while breastfeeding or pumping. You may want to start  by applying warm, moist heat (in the shower or with warm water-soaked hand towels) just before feeding or pumping. This increases circulation and helps the milk flow. If your baby does not completely empty your breasts while breastfeeding, pump any extra milk after he or she is finished.  Wear a snug bra (nursing or regular) or tank top for 1-2 days to signal your body to slightly decrease milk production.  Apply ice packs to your breasts, unless this is too uncomfortable for you.  Make sure that your baby is latched on and positioned properly while breastfeeding. If engorgement persists after 48 hours of following these recommendations, contact your health care provider or a Advertising copywriter. OVERALL HEALTH CARE RECOMMENDATIONS WHILE BREASTFEEDING  Eat healthy foods. Alternate between meals and snacks, eating 3 of each per day. Because what you eat affects your breast milk, some of the foods may make your baby more irritable than usual. Avoid eating these foods if you are sure that they are negatively affecting your baby.  Drink milk, fruit juice, and water to satisfy your thirst (about 10 glasses a day).  Rest often, relax, and continue to take your prenatal vitamins to prevent fatigue, stress, and anemia.  Continue breast self-awareness checks.  Avoid  chewing and smoking tobacco. Chemicals from cigarettes that pass into breast milk and exposure to secondhand smoke may harm your baby.  Avoid alcohol and drug use, including marijuana. Some medicines that may be harmful to your baby can pass through breast milk. It is important to ask your health care provider before taking any medicine, including all over-the-counter and prescription medicine as well as vitamin and herbal supplements. It is possible to become pregnant while breastfeeding. If birth control is desired, ask your health care provider about options that will be safe for your baby. SEEK MEDICAL CARE IF:  You feel like you want to stop breastfeeding or have become frustrated with breastfeeding.  You have painful breasts or nipples.  Your nipples are cracked or bleeding.  Your breasts are red, tender, or warm.  You have a swollen area on either breast.  You have a fever or chills.  You have nausea or vomiting.  You have drainage other than breast milk from your nipples.  Your breasts do not become full before feedings by the fifth day after you give birth.  You feel sad and depressed.  Your baby is too sleepy to eat well.  Your baby is having trouble sleeping.   Your baby is wetting less than 3 diapers in a 24-hour period.  Your baby has less than 3 stools in a 24-hour period.  Your baby's skin or the white part of his or her eyes becomes yellow.   Your baby is not gaining weight by 76 days of age. SEEK IMMEDIATE MEDICAL CARE IF:  Your baby is overly tired (lethargic) and does not want to wake up and feed.  Your baby develops an unexplained fever.   This information is not intended to replace advice given to you by your health care provider. Make sure you discuss any questions you have with your health care provider.   Document Released: 02/18/2005 Document Revised: 11/09/2014 Document Reviewed: 08/12/2012 Elsevier Interactive Patient Education 2016 [employer]. Labor Induction Labor induction is when steps are taken to cause a pregnant woman to begin the labor process. Most women go into labor on their own between 37 weeks and 42 weeks of the pregnancy. When this does not happen or when there  is a medical need, methods may be used to induce labor. Labor induction causes a pregnant woman's uterus to contract. It also causes the cervix to soften (ripen), open (dilate), and thin out (efface). Usually, labor is not induced before 39 weeks of the pregnancy unless there is a problem with the baby or mother.  Before inducing labor, your health care provider will consider a number of factors, including the following:  The medical condition of you and the baby.   How many weeks along you are.   The status of the baby's lung maturity.   The condition of the cervix.   The position of the baby.  WHAT ARE THE REASONS FOR LABOR INDUCTION? Labor may be induced for the following reasons:  The health of the baby or mother is at risk.   The pregnancy is overdue by 1 week or more.   The water breaks but labor does not start on its own.   The mother has a health condition or serious illness, such as high blood pressure, infection, placental abruption, or diabetes.  The amniotic fluid amounts are low around the baby.   The baby is distressed.  Convenience or wanting the baby to be born on a certain date is not a reason for inducing labor. WHAT METHODS ARE USED FOR LABOR INDUCTION? Several methods of labor induction may be used, such as:   Prostaglandin medicine. This medicine causes the cervix to dilate and ripen. The medicine will also start contractions. It can be taken by mouth or by inserting a suppository into the vagina.   Inserting a thin tube (catheter) with a balloon on the end into the vagina to dilate the cervix. Once inserted, the balloon is expanded with water, which causes the cervix to open.   Stripping the membranes. Your  health care provider separates amniotic sac tissue from the cervix, causing the cervix to be stretched and causing the release of a hormone called progesterone. This may cause the uterus to contract. It is often done during an office visit. You will be sent home to wait for the contractions to begin. You will then come in for an induction.   Breaking the water. Your health care provider makes a hole in the amniotic sac using a small instrument. Once the amniotic sac breaks, contractions should begin. This may still take hours to see an effect.   Medicine to trigger or strengthen contractions. This medicine is given through an IV access tube inserted into a vein in your arm.  All of the methods of induction, besides stripping the membranes, will be done in the hospital. Induction is done in the hospital so that you and the baby can be carefully monitored.  HOW LONG DOES IT TAKE FOR LABOR TO BE INDUCED? Some inductions can take up to 2-3 days. Depending on the cervix, it usually takes less time. It takes longer when you are induced early in the pregnancy or if this is your first pregnancy. If a mother is still pregnant and the induction has been going on for 2-3 days, either the mother will be sent home or a cesarean delivery will be needed. WHAT ARE THE RISKS ASSOCIATED WITH LABOR INDUCTION? Some of the risks of induction include:   Changes in fetal heart rate, such as too high, too low, or erratic.   Fetal distress.   Chance of infection for the mother and baby.   Increased chance of having a cesarean delivery.   Breaking off (abruption) of  the placenta from the uterus (rare).   Uterine rupture (very rare).  When induction is needed for medical reasons, the benefits of induction may outweigh the risks. WHAT ARE SOME REASONS FOR NOT INDUCING LABOR? Labor induction should not be done if:   It is shown that your baby does not tolerate labor.   You have had previous surgeries on  your uterus, such as a myomectomy or the removal of fibroids.   Your placenta lies very low in the uterus and blocks the opening of the cervix (placenta previa).   Your baby is not in a head-down position.   The umbilical cord drops down into the birth canal in front of the baby. This could cut off the baby's blood and oxygen supply.   You have had a previous cesarean delivery.   There are unusual circumstances, such as the baby being extremely premature.    This information is not intended to replace advice given to you by your health care provider. Make sure you discuss any questions you have with your health care provider.   Document Released: 07/10/2006 Document Revised: 03/11/2014 Document Reviewed: 09/17/2012 Elsevier Interactive Patient Education [employer]

## 2015-04-24 NOTE — Progress Notes (Signed)
Mod. blood on udip today - pt denies pain or burning w/ urination but does have frequency.

## 2015-04-24 NOTE — Progress Notes (Signed)
Subjective:  Victoria Christensen is a 30 y.o. G1P0000 at [redacted]w[redacted]d being seen today for ongoing prenatal care.  She is currently monitored for the following issues for this high-risk pregnancy and has Supervision of high risk pregnancy, antepartum; Gestational diabetes mellitus (GDM), antepartum; Abnormal maternal serum screening test; and Insufficient prenatal care in third trimester on her problem list.  Patient reports no complaints- UA with blood but denies dysuria. Reports polyuria.   Contractions: Irregular. Vag. Bleeding: None.  Movement: Present. Denies leaking of fluid.   The following portions of the patient's history were reviewed and updated as appropriate: allergies, current medications, past family history, past medical history, past social history, past surgical history and problem list. Problem list updated.  Objective:   Filed Vitals:   04/24/15 0852  BP: 127/60  Pulse: 91  Weight: 240 lb 1.6 oz (108.909 kg)    Fetal Status: Fetal Heart Rate (bpm): NST   Movement: Present     General:  Alert, oriented and cooperative. Patient is in no acute distress.  Skin: Skin is warm and dry. No rash noted.   Cardiovascular: Normal heart rate noted  Respiratory: Normal respiratory effort, no problems with respiration noted  Abdomen: Soft, gravid, appropriate for gestational age. Pain/Pressure: Present     Pelvic: Vag. Bleeding: None     Cervical exam deferred        Extremities: Normal range of motion.     Mental Status: Normal mood and affect. Normal behavior. Normal judgment and thought content.   Urinalysis: Urine Protein: 1+ Urine Glucose: Negative  Patient only has meter 2/20 135 (typically 125-155)- per patient 2hrs pp 180s- 230s consistently  Assessment and Plan:  Pregnancy: G1P0000 at [redacted]w[redacted]d  1. Gestational diabetes mellitus (GDM), antepartum, gestational diabetes method of control unspecified - Reviewed available values on meter, NOT well controlled - Increase to Glyburide   BID, DM Ed in 1 week. Plan to increase to  BID if continued hyperglycemia - Discussed risk of still birth with grossly elevated sugars and need for IOl at 39 weeks which is currently complicated by breech presentation. - Fetal nonstress test reactive  2. Abnormal maternal serum screening test  3. Insufficient prenatal care in third trimester  4. Supervision of high risk pregnancy, antepartum, third trimester Up to date Currently breech, follow closely and schedule for [id number]    5. Mod blood in UA- increased frequency but no dysuria - Urine culture sent, follow up results  Preterm labor symptoms and general obstetric precautions including but not limited to vaginal bleeding, contractions, leaking of fluid and fetal movement were reviewed in detail with the patient. Please refer to After Visit Summary for other counseling recommendations.  Return for DM Education 1 wk.  Future Appointments Date Time Provider Department Center  04/28/2015 8:00 AM [facility]  05/01/2015 10:30 AM [facility]     Federico Flake, MD

## 2015-04-25 LAB — PROTEIN / CREATININE RATIO, URINE
Creatinine, Urine: 57 mg/dL (ref 20–320)
Protein Creatinine Ratio: 895 mg/g creat — ABNORMAL HIGH (ref 21–161)
TOTAL PROTEIN, URINE: 51 mg/dL — AB (ref 5–24)

## 2015-04-26 LAB — CULTURE, OB URINE: Colony Count: 80000

## 2015-04-28 ENCOUNTER — Ambulatory Visit (INDEPENDENT_AMBULATORY_CARE_PROVIDER_SITE_OTHER): Payer: Medicaid Other | Admitting: *Deleted

## 2015-04-28 VITALS — BP 111/70 | HR 88

## 2015-04-28 DIAGNOSIS — Z36 Encounter for antenatal screening of mother: Secondary | ICD-10-CM

## 2015-04-28 DIAGNOSIS — O24419 Gestational diabetes mellitus in pregnancy, unspecified control: Secondary | ICD-10-CM

## 2015-04-28 NOTE — Progress Notes (Signed)
[place] for growth scheduled 3/17.  Pt did not bring CBG log book for review today. Pt instructed to bring both log book and glucose meter to appt on 2/27. She voiced understanding.

## 2015-04-28 NOTE — Progress Notes (Signed)
NST reactive.

## 2015-05-01 ENCOUNTER — Other Ambulatory Visit (HOSPITAL_COMMUNITY)
Admission: RE | Admit: 2015-05-01 | Discharge: 2015-05-01 | Disposition: A | Discharge status: Home / Self Care | Payer: Medicaid Other | Source: Ambulatory Visit | Attending: Obstetrics & Gynecology | Admitting: Obstetrics & Gynecology

## 2015-05-01 ENCOUNTER — Ambulatory Visit (INDEPENDENT_AMBULATORY_CARE_PROVIDER_SITE_OTHER): Payer: Medicaid Other | Admitting: Obstetrics & Gynecology

## 2015-05-01 ENCOUNTER — Telehealth: Payer: Self-pay | Admitting: *Deleted

## 2015-05-01 DIAGNOSIS — O0933 Supervision of pregnancy with insufficient antenatal care, third trimester: Secondary | ICD-10-CM

## 2015-05-01 DIAGNOSIS — O24419 Gestational diabetes mellitus in pregnancy, unspecified control: Secondary | ICD-10-CM | POA: Diagnosis not present

## 2015-05-01 DIAGNOSIS — Z113 Encounter for screening for infections with a predominantly sexual mode of transmission: Secondary | ICD-10-CM | POA: Insufficient documentation

## 2015-05-01 DIAGNOSIS — O0993 Supervision of high risk pregnancy, unspecified, third trimester: Secondary | ICD-10-CM

## 2015-05-01 LAB — POCT URINALYSIS DIP (DEVICE)
Bilirubin Urine: NEGATIVE
GLUCOSE, UA: NEGATIVE mg/dL
KETONES UR: NEGATIVE mg/dL
Nitrite: NEGATIVE
PROTEIN: 30 mg/dL — AB
SPECIFIC GRAVITY, URINE: 1.015 (ref 1.005–1.030)
Urobilinogen, UA: 1 mg/dL (ref 0.0–1.0)
pH: 7 (ref 5.0–8.0)

## 2015-05-01 LAB — OB RESULTS CONSOLE GBS: STREP GROUP B AG: POSITIVE

## 2015-05-01 MED ORDER — "INSULIN SYRINGE 29G X 1/2"" 1 ML MISC": 100.0000 | Freq: Four times a day (QID) | Status: DC

## 2015-05-01 MED ORDER — "INSULIN SYRINGE 29G X 1/2"" 1 ML MISC": Status: DC

## 2015-05-01 MED ORDER — INSULIN REGULAR HUMAN 100 UNIT/ML IJ SOLN: INTRAMUSCULAR | Status: DC

## 2015-05-01 MED ORDER — INSULIN NPH (HUMAN) (ISOPHANE) 100 UNIT/ML ~~LOC~~ SUSP: SUBCUTANEOUS | Status: DC

## 2015-05-01 MED ORDER — INSULIN NPH (HUMAN) (ISOPHANE) 100 UNIT/ML ~~LOC~~ SUSP: 18.0000 [IU] | Freq: Two times a day (BID) | SUBCUTANEOUS | Status: DC

## 2015-05-01 MED ORDER — INSULIN REGULAR HUMAN 100 UNIT/ML IJ SOLN: 18.0000 [IU] | Freq: Two times a day (BID) | INTRAMUSCULAR | Status: DC

## 2015-05-01 NOTE — Progress Notes (Signed)
Patient advised to discontinue Glyburide. Patient verbalized understanding.

## 2015-05-01 NOTE — Patient Instructions (Signed)
How and Where to Give Subcutaneous Insulin Injections, Adult  People with type 1 diabetes must take insulin since their bodies do not make it. People with type 2 diabetes may require insulin. There are many different types of insulin as well as other injectable diabetes medicines that are meant to be injected into the fat layer under your skin. The type of insulin or injectable diabetes medicine you take may determine how many injections you give yourself and when to take the injections.   CHOOSING A SITE FOR INJECTION  Insulin absorption varies from site to site. As with any injectable medication it is best for the insulin to be injected within the same body region. However, do not inject the insulin in the same spot each time. Rotating the spots you give your injections will prevent inflammation or tissue breakdown. There are four main regions that can be used for injections. The regions include the:   Abdomen (preferred region, especially for non-insulin injectable diabetes medicine).   Front and upper outer sides of thighs.   Back of upper arm.   Buttocks.  USING A SYRINGE AND VIAL  Drawing up insulin: single insulin dose  1. Wash your hands with soap and water.  2. Gently roll the insulin bottle (vial) between your hands to mix it. Do not shake the vial.  3. Clean the top rubber part of the vial with an alcohol wipe. Be sure that the plastic pop-top has been removed on newer vials.  4. Remove the plastic cover from the needle on the syringe. Do not let the needle touch anything.  5. Pull the plunger back to draw air into the syringe. The air should be the same amount as the insulin dose.  6. Push the needle through the rubber on the top of the vial. Do not turn the vial over.  7. Push the plunger in all the way to put the air into the vial.  8. Leave the needle in the vial and turn the vial and syringe upside down.  9. Pull down slowly on the plunger, drawing the amount of insulin you need into the  syringe.  10. Look for air bubbles in the syringe. You may need to push the plunger up and down 2 to 3 times to slowly get rid of any air bubbles in the syringe.  11. Pull back the plunger to get your correct dose.  12. Remove the needle from the vial.  13. Use an alcohol wipe to clean the area of the body to be injected.  14. Pinch up 1 inch of skin and hold it.  15. Put the needle straight into the skin (90-degree angle). Put the needle in as far as it will go (to the hub). The needle may need to be injected at a 45-degree angle in small adults with little fat.  16. When the needle is in, you can let go of your skin.  17. Push the plunger down all the way to inject the insulin.  18. Pull the needle straight out of the skin.  19. Press the alcohol wipe over the spot where you gave your injection. Keep it there for a few seconds. Do not rub the area.  20. Do not put the plastic cover back on the needle.  Drawing up insulin: mixing 2 insulins  1. Wash your hands with soap and water.  2. Gently roll the vial of "cloudy" insulin between your hands or rotate the vial from top to bottom to   mix.  3. Clean the top of both vials with an alcohol wipe. Be sure that the plastic pop-top lid has been removed on newer vials.  4. Pull air into the syringe to equal the dose of "cloudy" insulin.  5. Stick the needle into the "cloudy" insulin vial and inject the air. Be sure to keep the vial upright.  6. Remove the needle from the "cloudy" insulin vial.  7. Pull air into the syringe to equal the dose of "clear" insulin.  8. Stick the needle into the "clear" insulin vial and inject the air.  9. Leave the needle in the "clear" insulin vial and turn the vial upside down.  10. Pull down on the plunger and slowly draw into the syringe the number of units of "clear" insulin desired.  11. Look for air bubbles in the syringe. You may need to push the plunger up and down 2 to 3 times to slowly get rid of any air bubbles in the  syringe.  12. Remove the needle from the "clear" insulin vial.  13. Stick the needle into the "cloudy" insulin vial. Do not inject any of the "clear" insulin into the "cloudy" vial.  14. Turn the "cloudy" vial upside down and pull the plunger down to the number of units that equals the total number of units of "clear" and "cloudy" insulins.  15. Remove the needle from the "cloudy" insulin vial.  16. Use an alcohol wipe to clean the area of the body to be injected.  17. Put the needle straight into the skin (90-degree angle). Put the needle in as far as it will go (to the hub). The needle may need to be injected at a 45-degree angle in small adults with little fat.  18. When the needle is in, you can let go of your skin.  19. Push the plunger down all the way to inject the insulin.  20. Pull the needle straight out of the skin.  21. Press the alcohol wipe over the spot where you gave your injection. Keep it there for a few seconds. Do not rub the area.  22. Do not put the plastic cover back on the needle.  USING INSULIN PENS  1. Wash your hands with soap and water.  2. If you are using the "cloudy" insulin, roll the pen between your palms several times or rotate the pen top to bottom several times.  3. Remove the insulin pen cap.  4. Clean the rubber stopper of the cartridge with an alcohol wipe.  5. Remove the protective paper tab from the disposable needle.  6. Screw the needle onto the pen.  7. Remove the outer plastic needle cover.  8. Remove the inner plastic needle cover.  9. Prime the insulin pen by turning the button (dial) to 2 units. Hold the pen with the needle pointing up, and push the dial on the opposite end until a drop of insulin appears at the needle tip. If no insulin appears, repeat this step.  10. Dial the number of units of insulin you will inject.  11. Use an alcohol wipe to clean the area of the body to be injected.  12. Pinch up 1 inch of skin and hold it.  13. Put the needle straight into the  skin (90-degree angle).  14. Push the dial down to push the insulin into the fat tissue.  15. Count to 10 slowly. Then, remove the needle from the fat tissue.  16. Carefully replace the   larger outer plastic needle cover over the needle and unscrew the capped needle.  THROWING AWAY SUPPLIES   Discard used needles in a puncture proof sharps disposal container. Follow disposal regulations for the area where you live.   Vials and empty disposable pens may be thrown away in the regular trash.     This information is not intended to replace advice given to you by your health care provider. Make sure you discuss any questions you have with your health care provider.     Document Released: 05/11/2003 Document Revised: 03/11/2014 Document Reviewed: 07/28/2012  Elsevier Interactive Patient Education 2016 [employer]

## 2015-05-01 NOTE — Progress Notes (Signed)
Patient seen for initiation of insulin. In discussion with Dr. Roselie Awkward, Dosing of NPH & Regular calculated at wt of 244lb. = 111KG= X1unit/kg/day . This is lesser than I originally discussed with patient. Patient due back Friday for NST. Advised patient to show Diane her readings for further evaluation of increasing insulin. Left phone message with patient related to decreased dosing. Insulin Instruction  The following learning objectives were met by the patient during this visit:  Insulin Action of NPH & Regular insulins  Reviewed syringe & vial including # units per syringe,    length of needles, vial   Hygiene and storage  Drawing up single    Single dose  Rotation of Sites  Hypoglycemia- symptoms, causes , treatment choices  Record keeping and MD follow up  Hypoglycemia, causes, symptoms and treatment   Patient demonstrated understanding of insulin administration                                   Patient to start on insulin as Rx'd by MD Patient will be seen for follow-up on Friday for NST and f/u on insulin needs/adjustment

## 2015-05-01 NOTE — Progress Notes (Signed)
Pt called clinic @ 1459 and left message stating that her insulin Rx was not sent to her pharmacy. I called pt back and informed her that I will e-prescribe the insulin to her pharmacy. I also advised that her dosage was changed and she needs to be sure to read the instructions when she receives the medication. The new dosages are less than what she was originally instructed. I provided the new dosage information to pt and she voiced understanding of all information given.

## 2015-05-01 NOTE — Progress Notes (Signed)
NST reactive, breech by [place] 2/24 Subjective:  Kaela A Ast is a 30 y.o. G1P0000 at [redacted]w[redacted]d being seen today for ongoing prenatal care.  She is currently monitored for the following issues for this high-risk pregnancy and has Supervision of high risk pregnancy, antepartum; Gestational diabetes mellitus (GDM), antepartum; Abnormal maternal serum screening test; Insufficient prenatal care in third trimester; and Breech presentation on her problem list.  Patient reports pressure .   .  .   . Denies leaking of fluid.   The following portions of the patient's history were reviewed and updated as appropriate: allergies, current medications, past family history, past medical history, past social history, past surgical history and problem list. Problem list updated.  Objective:  There were no vitals filed for this visit.  Fetal Status:           General:  Alert, oriented and cooperative. Patient is in no acute distress.  Skin: Skin is warm and dry. No rash noted.   Cardiovascular: Normal heart rate noted  Respiratory: Normal respiratory effort, no problems with respiration noted  Abdomen: Soft, gravid, appropriate for gestational age.       Pelvic:       Cervical exam performed        Extremities: Normal range of motion.     Mental Status: Normal mood and affect. Normal behavior. Normal judgment and thought content.   Urinalysis:      Assessment and Plan:  Pregnancy: G1P0000 at [redacted]w[redacted]d  1. Gestational diabetes mellitus (GDM), antepartum, gestational diabetes method of control unspecified FBS in 120s, PP up to 250+ on oral agents, start insulin today - Fetal nonstress test NST reactive 2. Supervision of high risk pregnancy, antepartum, third trimester DM poor control  Preterm labor symptoms and general obstetric precautions including but not limited to vaginal bleeding, contractions, leaking of fluid and fetal movement were reviewed in detail with the patient. Please refer to After Visit  Summary for other counseling recommendations.  Return in about 4 days (around 05/05/2015) for 2x/wk as scheduled.   Adam Phenix, MD

## 2015-05-02 LAB — GC/CHLAMYDIA PROBE AMP (~~LOC~~) NOT AT ARMC
CHLAMYDIA, DNA PROBE: NEGATIVE
NEISSERIA GONORRHEA: NEGATIVE

## 2015-05-03 LAB — CULTURE, BETA STREP (GROUP B ONLY)

## 2015-05-05 ENCOUNTER — Ambulatory Visit (INDEPENDENT_AMBULATORY_CARE_PROVIDER_SITE_OTHER): Payer: Medicaid Other | Admitting: *Deleted

## 2015-05-05 DIAGNOSIS — Z36 Encounter for antenatal screening of mother: Secondary | ICD-10-CM | POA: Diagnosis not present

## 2015-05-05 DIAGNOSIS — O24419 Gestational diabetes mellitus in pregnancy, unspecified control: Secondary | ICD-10-CM | POA: Diagnosis not present

## 2015-05-05 NOTE — Progress Notes (Signed)
NST today reviewed, reactive

## 2015-05-05 NOTE — Progress Notes (Signed)
Pt states she began taking insulin on 3/1. CBG log reviewed - still some elevated values but trending lower. FBS 103 x2 days and PP 131-185.  Pt encouraged to continue to monitor diet, check CBG as directed and we will re-evaluate @ next appt on 3/6.

## 2015-05-08 ENCOUNTER — Encounter (HOSPITAL_COMMUNITY): Payer: Self-pay | Admitting: *Deleted

## 2015-05-08 ENCOUNTER — Ambulatory Visit (INDEPENDENT_AMBULATORY_CARE_PROVIDER_SITE_OTHER): Payer: Medicaid Other | Admitting: Family Medicine

## 2015-05-08 ENCOUNTER — Telehealth (HOSPITAL_COMMUNITY): Payer: Self-pay | Admitting: *Deleted

## 2015-05-08 ENCOUNTER — Encounter: Payer: Self-pay | Admitting: Family Medicine

## 2015-05-08 VITALS — BP 125/67 | HR 108 | Wt 249.0 lb

## 2015-05-08 DIAGNOSIS — O0993 Supervision of high risk pregnancy, unspecified, third trimester: Secondary | ICD-10-CM

## 2015-05-08 DIAGNOSIS — O24419 Gestational diabetes mellitus in pregnancy, unspecified control: Secondary | ICD-10-CM

## 2015-05-08 LAB — POCT URINALYSIS DIP (DEVICE)
Bilirubin Urine: NEGATIVE
GLUCOSE, UA: NEGATIVE mg/dL
Ketones, ur: NEGATIVE mg/dL
Nitrite: NEGATIVE
PROTEIN: 100 mg/dL — AB
Specific Gravity, Urine: 1.015 (ref 1.005–1.030)
UROBILINOGEN UA: 0.2 mg/dL (ref 0.0–1.0)
pH: 7 (ref 5.0–8.0)

## 2015-05-08 LAB — FETAL NONSTRESS TEST

## 2015-05-08 NOTE — Progress Notes (Signed)
Persistent Complete breech presentation.  US for growth scheduled 3/17

## 2015-05-08 NOTE — Telephone Encounter (Signed)
Preadmission screen  

## 2015-05-08 NOTE — Progress Notes (Signed)
  Subjective:  Victoria Christensen is a 30 y.o. G1P0000 at 342w5d being seen today for ongoing prenatal care.  She is currently monitored for the following issues for this high-risk pregnancy and has Supervision of high risk pregnancy, antepartum; Gestational diabetes mellitus (GDM), antepartum; Abnormal maternal serum screening test; Insufficient prenatal care in third trimester; and Breech presentation on her problem list.  Patient reports no complaints.  Contractions: Irregular. Vag. Bleeding: None.  Movement: Present. Denies leaking of fluid.   The following portions of the patient's history were reviewed and updated as appropriate: allergies, current medications, past family history, past medical history, past social history, past surgical history and problem list. Problem list updated.  Objective:   Filed Vitals:   05/08/15 0850  BP: 125/67  Pulse: 108  Weight: 249 lb (112.946 kg)    Fetal Status: Fetal Heart Rate (bpm): NST Fundal Height: 51 cm Movement: Present  Presentation: Complete Breech  General:  Alert, oriented and cooperative. Patient is in no acute distress.  Skin: Skin is warm and dry. No rash noted.   Cardiovascular: Normal heart rate noted  Respiratory: Normal respiratory effort, no problems with respiration noted  Abdomen: Soft, gravid, appropriate for gestational age. Pain/Pressure: Present     Pelvic: Vag. Bleeding: None     Cervical exam deferred        Extremities: Normal range of motion.  Edema: Trace  Mental Status: Normal mood and affect. Normal behavior. Normal judgment and thought content.   FBS 74-103 since insulin start 2 hour pp 70-142 NST reviewed and reactive.  Assessment and Plan:  Pregnancy: G1P0000 at 562w5d  1. Gestational diabetes mellitus (GDM), antepartum, gestational diabetes method of control unspecified BS appear to be much better controlled with insulin addition - Fetal nonstress test  2. Supervision of high risk pregnancy, antepartum,  third trimester Continue routine prenatal care.  3. Breech presentation Given the size of the baby - do not believe we should attempt ECV. Scheduled for primary C-section   Preterm labor symptoms and general obstetric precautions including but not limited to vaginal bleeding, contractions, leaking of fluid and fetal movement were reviewed in detail with the patient. Please refer to After Visit Summary for other counseling recommendations.  Return in about 4 days (around 05/12/2015) for 2x/wk as scheduled.   Reva Boresanya S Pratt, MD

## 2015-05-10 ENCOUNTER — Encounter (HOSPITAL_COMMUNITY): Payer: Self-pay | Admitting: *Deleted

## 2015-05-10 ENCOUNTER — Encounter (HOSPITAL_COMMUNITY)
Admission: AD | Disposition: A | Discharge status: Home / Self Care | Payer: Self-pay | Source: Ambulatory Visit | Attending: Obstetrics & Gynecology

## 2015-05-10 ENCOUNTER — Encounter: Payer: Self-pay | Admitting: *Deleted

## 2015-05-10 ENCOUNTER — Inpatient Hospital Stay (HOSPITAL_COMMUNITY): Payer: Medicaid Other | Admitting: Anesthesiology

## 2015-05-10 ENCOUNTER — Inpatient Hospital Stay (HOSPITAL_COMMUNITY)
Admission: AD | Admit: 2015-05-10 | Discharge: 2015-05-13 | DRG: 765 | Disposition: A | Discharge status: Home / Self Care | Payer: Medicaid Other | Source: Ambulatory Visit | Attending: Obstetrics & Gynecology | Admitting: Obstetrics & Gynecology

## 2015-05-10 DIAGNOSIS — Z6841 Body Mass Index (BMI) 40.0 and over, adult: Secondary | ICD-10-CM

## 2015-05-10 DIAGNOSIS — O99214 Obesity complicating childbirth: Secondary | ICD-10-CM | POA: Diagnosis present

## 2015-05-10 DIAGNOSIS — Z833 Family history of diabetes mellitus: Secondary | ICD-10-CM | POA: Diagnosis not present

## 2015-05-10 DIAGNOSIS — O4292 Full-term premature rupture of membranes, unspecified as to length of time between rupture and onset of labor: Secondary | ICD-10-CM | POA: Diagnosis present

## 2015-05-10 DIAGNOSIS — Z3A37 37 weeks gestation of pregnancy: Secondary | ICD-10-CM

## 2015-05-10 DIAGNOSIS — O280 Abnormal hematological finding on antenatal screening of mother: Secondary | ICD-10-CM

## 2015-05-10 DIAGNOSIS — Z794 Long term (current) use of insulin: Secondary | ICD-10-CM

## 2015-05-10 DIAGNOSIS — O2412 Pre-existing diabetes mellitus, type 2, in childbirth: Secondary | ICD-10-CM | POA: Diagnosis not present

## 2015-05-10 DIAGNOSIS — Z3490 Encounter for supervision of normal pregnancy, unspecified, unspecified trimester: Secondary | ICD-10-CM

## 2015-05-10 DIAGNOSIS — O24424 Gestational diabetes mellitus in childbirth, insulin controlled: Secondary | ICD-10-CM | POA: Diagnosis present

## 2015-05-10 DIAGNOSIS — O24419 Gestational diabetes mellitus in pregnancy, unspecified control: Secondary | ICD-10-CM

## 2015-05-10 DIAGNOSIS — O3663X0 Maternal care for excessive fetal growth, third trimester, not applicable or unspecified: Secondary | ICD-10-CM | POA: Diagnosis present

## 2015-05-10 DIAGNOSIS — Z87891 Personal history of nicotine dependence: Secondary | ICD-10-CM

## 2015-05-10 DIAGNOSIS — O321XX0 Maternal care for breech presentation, not applicable or unspecified: Principal | ICD-10-CM | POA: Diagnosis present

## 2015-05-10 DIAGNOSIS — O99824 Streptococcus B carrier state complicating childbirth: Secondary | ICD-10-CM | POA: Diagnosis present

## 2015-05-10 DIAGNOSIS — E119 Type 2 diabetes mellitus without complications: Secondary | ICD-10-CM

## 2015-05-10 DIAGNOSIS — O0993 Supervision of high risk pregnancy, unspecified, third trimester: Secondary | ICD-10-CM

## 2015-05-10 HISTORY — DX: Personal history of nicotine dependence: Z87.891

## 2015-05-10 LAB — CBC
HCT: 36.6 % (ref 36.0–46.0)
Hemoglobin: 11.8 g/dL — ABNORMAL LOW (ref 12.0–15.0)
MCH: 25.7 pg — AB (ref 26.0–34.0)
MCHC: 32.2 g/dL (ref 30.0–36.0)
MCV: 79.7 fL (ref 78.0–100.0)
PLATELETS: 150 10*3/uL (ref 150–400)
RBC: 4.59 MIL/uL (ref 3.87–5.11)
RDW: 14.3 % (ref 11.5–15.5)
WBC: 8.6 10*3/uL (ref 4.0–10.5)

## 2015-05-10 LAB — GLUCOSE, CAPILLARY
Glucose-Capillary: 96 mg/dL (ref 65–99)
Glucose-Capillary: 98 mg/dL (ref 65–99)

## 2015-05-10 LAB — TYPE AND SCREEN
ABO/RH(D): B POS
ANTIBODY SCREEN: NEGATIVE

## 2015-05-10 SURGERY — Surgical Case
Anesthesia: Spinal

## 2015-05-10 MED ORDER — OXYTOCIN 10 UNIT/ML IJ SOLN: 2.5000 [IU]/h | INTRAVENOUS | Status: AC

## 2015-05-10 MED ORDER — NALBUPHINE HCL 10 MG/ML IJ SOLN
INTRAMUSCULAR | Status: AC
  Filled 2015-05-10: qty 1

## 2015-05-10 MED ORDER — OXYTOCIN 10 UNIT/ML IJ SOLN
40.0000 [IU] | INTRAVENOUS | Status: DC | PRN
  Administered 2015-05-10: 40 [IU] via INTRAVENOUS

## 2015-05-10 MED ORDER — NALOXONE HCL 0.4 MG/ML IJ SOLN: 0.4000 mg | INTRAMUSCULAR | Status: DC | PRN

## 2015-05-10 MED ORDER — DIPHENHYDRAMINE HCL 25 MG PO CAPS
25.0000 mg | ORAL_CAPSULE | Freq: Four times a day (QID) | ORAL | Status: DC | PRN
  Filled 2015-05-10: qty 1

## 2015-05-10 MED ORDER — MEPERIDINE HCL 25 MG/ML IJ SOLN: 6.2500 mg | INTRAMUSCULAR | Status: DC | PRN

## 2015-05-10 MED ORDER — TETANUS-DIPHTH-ACELL PERTUSSIS 5-2.5-18.5 LF-MCG/0.5 IM SUSP
0.5000 mL | Freq: Once | INTRAMUSCULAR | Status: DC
  Filled 2015-05-10: qty 0.5

## 2015-05-10 MED ORDER — NALBUPHINE HCL 10 MG/ML IJ SOLN: 5.0000 mg | INTRAMUSCULAR | Status: DC | PRN

## 2015-05-10 MED ORDER — DIBUCAINE 1 % RE OINT
1.0000 "application " | TOPICAL_OINTMENT | RECTAL | Status: DC | PRN
  Filled 2015-05-10: qty 28

## 2015-05-10 MED ORDER — NALBUPHINE HCL 10 MG/ML IJ SOLN: 5.0000 mg | Freq: Once | INTRAMUSCULAR | Status: AC | PRN

## 2015-05-10 MED ORDER — MORPHINE SULFATE (PF) 0.5 MG/ML IJ SOLN
INTRAMUSCULAR | Status: DC | PRN
  Administered 2015-05-10: .1 ug via INTRATHECAL

## 2015-05-10 MED ORDER — WITCH HAZEL-GLYCERIN EX PADS
1.0000 | MEDICATED_PAD | CUTANEOUS | Status: DC | PRN
Start: 2015-05-10 — End: 2015-05-13

## 2015-05-10 MED ORDER — MENTHOL 3 MG MT LOZG
1.0000 | LOZENGE | OROMUCOSAL | Status: DC | PRN
  Filled 2015-05-10: qty 9

## 2015-05-10 MED ORDER — BUPIVACAINE HCL (PF) 0.5 % IJ SOLN
INTRAMUSCULAR | Status: DC | PRN
  Administered 2015-05-10: 30 mL

## 2015-05-10 MED ORDER — LACTATED RINGERS IV SOLN
INTRAVENOUS | Status: DC
  Administered 2015-05-10: 125 mL/h via INTRAVENOUS

## 2015-05-10 MED ORDER — SCOPOLAMINE 1 MG/3DAYS TD PT72: 1.0000 | MEDICATED_PATCH | Freq: Once | TRANSDERMAL | Status: DC

## 2015-05-10 MED ORDER — IBUPROFEN 600 MG PO TABS
600.0000 mg | ORAL_TABLET | Freq: Four times a day (QID) | ORAL | Status: DC
Start: 2015-05-10 — End: 2015-05-13
  Administered 2015-05-10 – 2015-05-13 (×9): 600 mg via ORAL
  Filled 2015-05-10 (×12): qty 1

## 2015-05-10 MED ORDER — SIMETHICONE 80 MG PO CHEW
80.0000 mg | CHEWABLE_TABLET | ORAL | Status: DC | PRN
  Filled 2015-05-10: qty 1

## 2015-05-10 MED ORDER — DEXAMETHASONE SODIUM PHOSPHATE 4 MG/ML IJ SOLN
INTRAMUSCULAR | Status: DC | PRN
  Administered 2015-05-10: 4 mg via INTRAVENOUS

## 2015-05-10 MED ORDER — ONDANSETRON HCL 4 MG/2ML IJ SOLN
INTRAMUSCULAR | Status: DC | PRN
  Administered 2015-05-10: 4 mg via INTRAVENOUS

## 2015-05-10 MED ORDER — LANOLIN HYDROUS EX OINT
1.0000 | TOPICAL_OINTMENT | CUTANEOUS | Status: DC | PRN
Start: 2015-05-10 — End: 2015-05-13

## 2015-05-10 MED ORDER — DIPHENHYDRAMINE HCL 50 MG/ML IJ SOLN: 12.5000 mg | INTRAMUSCULAR | Status: DC | PRN

## 2015-05-10 MED ORDER — FENTANYL CITRATE (PF) 100 MCG/2ML IJ SOLN
INTRAMUSCULAR | Status: DC | PRN
  Administered 2015-05-10: 12.5 ug via INTRATHECAL

## 2015-05-10 MED ORDER — OXYTOCIN 10 UNIT/ML IJ SOLN: 40.0000 [IU] | INTRAVENOUS | Status: DC | PRN

## 2015-05-10 MED ORDER — SIMETHICONE 80 MG PO CHEW
80.0000 mg | CHEWABLE_TABLET | ORAL | Status: DC
  Administered 2015-05-11 (×2): 80 mg via ORAL
  Filled 2015-05-10 (×5): qty 1

## 2015-05-10 MED ORDER — ONDANSETRON HCL 4 MG/2ML IJ SOLN
4.0000 mg | Freq: Once | INTRAMUSCULAR | Status: AC
  Administered 2015-05-10: 4 mg via INTRAVENOUS
  Filled 2015-05-10: qty 2

## 2015-05-10 MED ORDER — NALBUPHINE HCL 10 MG/ML IJ SOLN
5.0000 mg | Freq: Once | INTRAMUSCULAR | Status: AC | PRN
  Administered 2015-05-10: 5 mg via INTRAVENOUS

## 2015-05-10 MED ORDER — ACETAMINOPHEN 325 MG PO TABS: 650.0000 mg | ORAL_TABLET | ORAL | Status: DC | PRN

## 2015-05-10 MED ORDER — CITRIC ACID-SODIUM CITRATE 334-500 MG/5ML PO SOLN
ORAL | Status: AC
  Filled 2015-05-10: qty 15

## 2015-05-10 MED ORDER — KETOROLAC TROMETHAMINE 30 MG/ML IJ SOLN: 30.0000 mg | Freq: Four times a day (QID) | INTRAMUSCULAR | Status: AC | PRN

## 2015-05-10 MED ORDER — LACTATED RINGERS IV SOLN: INTRAVENOUS | Status: DC

## 2015-05-10 MED ORDER — KETOROLAC TROMETHAMINE 30 MG/ML IJ SOLN
INTRAMUSCULAR | Status: AC
  Filled 2015-05-10: qty 1

## 2015-05-10 MED ORDER — SENNOSIDES-DOCUSATE SODIUM 8.6-50 MG PO TABS
2.0000 | ORAL_TABLET | ORAL | Status: DC
  Administered 2015-05-11 (×2): 2 via ORAL
  Filled 2015-05-10 (×5): qty 2

## 2015-05-10 MED ORDER — PRENATAL MULTIVITAMIN CH
1.0000 | ORAL_TABLET | Freq: Every day | ORAL | Status: DC
  Administered 2015-05-11 – 2015-05-12 (×2): 1 via ORAL
  Filled 2015-05-10 (×4): qty 1

## 2015-05-10 MED ORDER — NALOXONE HCL 2 MG/2ML IJ SOSY
1.0000 ug/kg/h | PREFILLED_SYRINGE | INTRAVENOUS | Status: DC | PRN
  Filled 2015-05-10: qty 2

## 2015-05-10 MED ORDER — CITRIC ACID-SODIUM CITRATE 334-500 MG/5ML PO SOLN
30.0000 mL | Freq: Once | ORAL | Status: AC
Start: 2015-05-10 — End: 2015-05-10
  Administered 2015-05-10: 30 mL via ORAL

## 2015-05-10 MED ORDER — FAMOTIDINE IN NACL 20-0.9 MG/50ML-% IV SOLN
INTRAVENOUS | Status: AC
  Administered 2015-05-10: 06:00:00
  Filled 2015-05-10: qty 50

## 2015-05-10 MED ORDER — CEFAZOLIN SODIUM-DEXTROSE 2-3 GM-% IV SOLR
2.0000 g | INTRAVENOUS | Status: AC
  Administered 2015-05-10: 2 g via INTRAVENOUS
  Filled 2015-05-10: qty 50

## 2015-05-10 MED ORDER — SODIUM CHLORIDE 0.9% FLUSH: 3.0000 mL | INTRAVENOUS | Status: DC | PRN

## 2015-05-10 MED ORDER — LACTATED RINGERS IV SOLN
INTRAVENOUS | Status: DC
  Administered 2015-05-10: 500 mL via INTRAVENOUS
  Administered 2015-05-10 (×3): via INTRAVENOUS

## 2015-05-10 MED ORDER — FENTANYL CITRATE (PF) 100 MCG/2ML IJ SOLN: 25.0000 ug | INTRAMUSCULAR | Status: DC | PRN

## 2015-05-10 MED ORDER — FAMOTIDINE 20 MG PO TABS
20.0000 mg | ORAL_TABLET | Freq: Once | ORAL | Status: DC
  Filled 2015-05-10 (×2): qty 1

## 2015-05-10 MED ORDER — GLYCOPYRROLATE 0.2 MG/ML IJ SOLN
INTRAMUSCULAR | Status: DC | PRN
  Administered 2015-05-10: 0.2 mg via INTRAVENOUS

## 2015-05-10 MED ORDER — BUPIVACAINE IN DEXTROSE 0.75-8.25 % IT SOLN
INTRATHECAL | Status: DC | PRN
  Administered 2015-05-10: 1.2 mg via INTRATHECAL

## 2015-05-10 MED ORDER — ZOLPIDEM TARTRATE 5 MG PO TABS: 5.0000 mg | ORAL_TABLET | Freq: Every evening | ORAL | Status: DC | PRN

## 2015-05-10 MED ORDER — DIPHENHYDRAMINE HCL 25 MG PO CAPS
25.0000 mg | ORAL_CAPSULE | ORAL | Status: DC | PRN
  Filled 2015-05-10: qty 1

## 2015-05-10 MED ORDER — ONDANSETRON HCL 4 MG/2ML IJ SOLN: 4.0000 mg | Freq: Three times a day (TID) | INTRAMUSCULAR | Status: DC | PRN

## 2015-05-10 MED ORDER — SIMETHICONE 80 MG PO CHEW
80.0000 mg | CHEWABLE_TABLET | Freq: Three times a day (TID) | ORAL | Status: DC
  Administered 2015-05-10 – 2015-05-13 (×8): 80 mg via ORAL
  Filled 2015-05-10 (×14): qty 1

## 2015-05-10 MED ORDER — SODIUM CHLORIDE 0.9 % IR SOLN
Status: DC | PRN
  Administered 2015-05-10: 1000 mL

## 2015-05-10 MED ORDER — PHENYLEPHRINE 8 MG IN D5W 100 ML (0.08MG/ML) PREMIX OPTIME
INJECTION | INTRAVENOUS | Status: DC | PRN
  Administered 2015-05-10: 40 ug/min via INTRAVENOUS

## 2015-05-10 MED ORDER — KETOROLAC TROMETHAMINE 30 MG/ML IJ SOLN
30.0000 mg | Freq: Four times a day (QID) | INTRAMUSCULAR | Status: AC | PRN
  Administered 2015-05-10: 30 mg via INTRAMUSCULAR

## 2015-05-10 SURGICAL SUPPLY — 37 items
BARRIER ADHS 3X4 INTERCEED (GAUZE/BANDAGES/DRESSINGS) IMPLANT
BRR ADH 4X3 ABS CNTRL BYND (GAUZE/BANDAGES/DRESSINGS)
CLAMP CORD UMBIL (MISCELLANEOUS) IMPLANT
CLOTH BEACON ORANGE TIMEOUT ST (SAFETY) ×3 IMPLANT
DRSG OPSITE POSTOP 4X10 (GAUZE/BANDAGES/DRESSINGS) ×3 IMPLANT
DURAPREP 26ML APPLICATOR (WOUND CARE) ×3 IMPLANT
ELECT REM PT RETURN 9FT ADLT (ELECTROSURGICAL) ×3
ELECTRODE REM PT RTRN 9FT ADLT (ELECTROSURGICAL) ×1 IMPLANT
EXTRACTOR VACUUM KIWI (MISCELLANEOUS) IMPLANT
GLOVE BIO SURGEON STRL SZ 6.5 (GLOVE) ×2 IMPLANT
GLOVE BIO SURGEONS STRL SZ 6.5 (GLOVE) ×1
GLOVE BIOGEL PI IND STRL 7.0 (GLOVE) ×2 IMPLANT
GLOVE BIOGEL PI INDICATOR 7.0 (GLOVE) ×4
GOWN STRL REUS W/TWL LRG LVL3 (GOWN DISPOSABLE) ×6 IMPLANT
KIT ABG SYR 3ML LUER SLIP (SYRINGE) IMPLANT
NDL HYPO 25X5/8 SAFETYGLIDE (NEEDLE) IMPLANT
NEEDLE HYPO 22GX1.5 SAFETY (NEEDLE) IMPLANT
NEEDLE HYPO 25X5/8 SAFETYGLIDE (NEEDLE) IMPLANT
NS IRRIG 1000ML POUR BTL (IV SOLUTION) ×3 IMPLANT
PACK C SECTION WH (CUSTOM PROCEDURE TRAY) ×3 IMPLANT
PAD ABD 8X10 STRL (GAUZE/BANDAGES/DRESSINGS) ×2 IMPLANT
PAD OB MATERNITY 4.3X12.25 (PERSONAL CARE ITEMS) ×3 IMPLANT
PENCIL SMOKE EVAC W/HOLSTER (ELECTROSURGICAL) ×3 IMPLANT
RETRACTOR TRAXI PANNICULUS (MISCELLANEOUS) IMPLANT
RETRACTOR WND ALEXIS 25 LRG (MISCELLANEOUS) IMPLANT
RTRCTR WOUND ALEXIS 25CM LRG (MISCELLANEOUS) ×3
SPONGE GAUZE 4X4 12PLY (GAUZE/BANDAGES/DRESSINGS) ×4 IMPLANT
SUT PLAIN 2 0 XLH (SUTURE) ×2 IMPLANT
SUT VIC AB 0 CT1 36 (SUTURE) ×18 IMPLANT
SUT VIC AB 2-0 CT1 27 (SUTURE) ×3
SUT VIC AB 2-0 CT1 TAPERPNT 27 (SUTURE) ×1 IMPLANT
SUT VIC AB 4-0 PS2 27 (SUTURE) ×3 IMPLANT
SYR CONTROL 10ML LL (SYRINGE) IMPLANT
TAPE CLOTH SURG 4X10 WHT LF (GAUZE/BANDAGES/DRESSINGS) ×2 IMPLANT
TOWEL OR 17X24 6PK STRL BLUE (TOWEL DISPOSABLE) ×3 IMPLANT
TRAXI PANNICULUS RETRACTOR (MISCELLANEOUS) ×2
TRAY FOLEY CATH SILVER 14FR (SET/KITS/TRAYS/PACK) IMPLANT

## 2015-05-10 NOTE — Anesthesia Procedure Notes (Signed)
Spinal Patient location during procedure: OR Staffing Anesthesiologist: Gustav Knueppel Performed by: anesthesiologist  Preanesthetic Checklist Completed: patient identified, site marked, surgical consent, pre-op evaluation, timeout performed, IV checked, risks and benefits discussed and monitors and equipment checked Spinal Block Patient position: sitting Prep: ChloraPrep Patient monitoring: heart rate, continuous pulse ox and blood pressure Approach: midline Location: L3-4 Injection technique: single-shot Needle Needle type: Sprotte  Needle gauge: 24 G Needle length: 9 cm Additional Notes Expiration date of kit checked and confirmed. Patient tolerated procedure well, without complications.     

## 2015-05-10 NOTE — Anesthesia Preprocedure Evaluation (Signed)
Anesthesia Evaluation  Patient identified by MRN, date of birth, ID band Patient awake    Reviewed: Allergy & Precautions, NPO status , Patient's Chart, lab work & pertinent test results  Airway Mallampati: III  TM Distance: >3 FB Neck ROM: Full    Dental no notable dental hx.    Pulmonary former smoker,    Pulmonary exam normal breath sounds clear to auscultation       Cardiovascular negative cardio ROS Normal cardiovascular exam Rhythm:Regular Rate:Normal     Neuro/Psych negative neurological ROS  negative psych ROS   GI/Hepatic negative GI ROS, Neg liver ROS,   Endo/Other  diabetes, Gestational, Insulin DependentMorbid obesity  Renal/GU negative Renal ROS  negative genitourinary   Musculoskeletal negative musculoskeletal ROS (+)   Abdominal   Peds negative pediatric ROS (+)  Hematology negative hematology ROS (+)   Anesthesia Other Findings   Reproductive/Obstetrics negative OB ROS (+) Pregnancy                             Anesthesia Physical Anesthesia Plan  ASA: III and emergent  Anesthesia Plan: Spinal   Post-op Pain Management:    Induction:   Airway Management Planned: Natural Airway  Additional Equipment:   Intra-op Plan:   Post-operative Plan:   Informed Consent: I have reviewed the patients History and Physical, chart, labs and discussed the procedure including the risks, benefits and alternatives for the proposed anesthesia with the patient or authorized representative who has indicated his/her understanding and acceptance.   Dental advisory given  Plan Discussed with: CRNA  Anesthesia Plan Comments:         Anesthesia Quick Evaluation

## 2015-05-10 NOTE — Lactation Note (Signed)
This note was copied from a baby's chart. Lactation Consultation Note  Patient Name: Victoria Christensen [id number]'s Date: 05/10/2015 Reason for consult: Initial assessment;Other (Comment) (Low Blood Glucose)   Initial consult with first time mom of 7 hour old infant. Infant born at 37 weeks via weighing 8 lb 0.8 oz. Pregnancy complicated by GDM with Insulin and PCOS (per mom). Infant has had low blood glucose readings (39-36-43) requiring glucose gel, repeat after glucose gel brought blood glucose to 43. Infant to have another blood glucose drawn at 1430.  Infant sucked on gloved finger easily with extended tongue and finger cupping noted. Assisted mom in awakening techniques and getting infant to latch, initially we were unable to get her latched. We hand expressed and gave infant 0.5 cc Colostrum. We were then able to get her to latch. Once latched, infant with rhythmic suckling and intermittent swallows for 20 minutes. Mom performed hand compression/and breast massage throughout feeding. Intermittent swallows were noted. Mom stimulated infant during feeding as needed to maintain suckling.   Mom with large compressible breasts and large everted nipples. Colostrum gtts expressed bilaterally, mom was able to return hand expression demonstration. Discussed with mom need for deep latch for milk transfer. Due to large nipples and infant not opening mouth, mom may need assistance to obtain deep latch. BF basics reviewed.  Enc mom to feed infant every 2-3 hours at first feeding cues. Advised mom that it would be best until blood glucose is stabilized to try to feed at breast every 2 hours or more often if infant showing cues, and to call for assistance if unable to get infant to feed.Awakening techniques reviewed. Enc her to follow feeding with hand expression and spoon feeding. Discussed with mom that if infants blood glucose remains low, we may have to supplement infant with formula, mom is agreeable to this.  Will determine if supplementation is needed post 14:30 blood glucose. Discussed with mom supply and demand to initiate and maintain an good milk supply and to always BF prior to giving formula. Mom voiced understanding.   Spoke with Nursery staff, Dr. Weston BrassEttafagh, and Kathie RhodesBetty, RN in regards to current feeding and possible need for supplementation based on next blood glucose.   LC Brochure and BF Resources Handout left at bedside, informed mom of IP/OP Services, LC Phone #, and BF Support Groups. [facility] and will call at d/c for follow up appt. Discussed with mom that she will need to begin pumping if formula supplementation is given to initiate milk supply, mom agreeable. Mom knows to call for assistance as needed.    Maternal Data Formula Feeding for Exclusion: No Has patient been taught Hand Expression?: Yes Does the patient have breastfeeding experience prior to this delivery?: No  Feeding Feeding Type: Breast Fed Length of feed: 20 min  LATCH Score/Interventions Latch: Repeated attempts needed to sustain latch, nipple held in mouth throughout feeding, stimulation needed to elicit sucking reflex. Intervention(s): Adjust position;Assist with latch;Breast massage;Breast compression  Audible Swallowing: A few with stimulation Intervention(s): Skin to skin;Hand expression;Alternate breast massage  Type of Nipple: Everted at rest and after stimulation  Comfort (Breast/Nipple): Soft / non-tender     Hold (Positioning): Assistance needed to correctly position infant at breast and maintain latch. Intervention(s): Breastfeeding basics reviewed;Support Pillows;Position options;Skin to skin  LATCH Score: 7  Lactation Tools Discussed/Used WIC Program: Yes   Consult Status Consult Status: Follow-up Date: 05/10/15 Follow-up type: In-patient    Silas FloodSharon S Kathia Covington 05/10/2015, 2:22  PM

## 2015-05-10 NOTE — Anesthesia Postprocedure Evaluation (Signed)
Anesthesia Post Note  Patient: Victoria Christensen  Procedure(s) Performed: Procedure(s) (LRB): CESAREAN SECTION (N/A)  Patient location during evaluation: PACU Anesthesia Type: Spinal Level of consciousness: awake and alert and oriented Pain management: pain level controlled Vital Signs Assessment: post-procedure vital signs reviewed and stable Respiratory status: spontaneous breathing, nonlabored ventilation and respiratory function stable Cardiovascular status: blood pressure returned to baseline and stable Postop Assessment: no headache, no backache, spinal receding, patient able to bend at knees and no signs of nausea or vomiting Anesthetic complications: no    Last Vitals:  Filed Vitals:   05/10/15 0755 05/10/15 0756  BP:    Pulse:  63  Temp:    Resp: 17 21    Last Pain:  Filed Vitals:   05/10/15 0759  PainSc: 0-No pain                 Jessicah Croll A.

## 2015-05-10 NOTE — Transfer of Care (Signed)
Immediate Anesthesia Transfer of Care Note  Patient: Victoria Christensen  Procedure(s) Performed: Procedure(s): CESAREAN SECTION (N/A)  Patient Location: PACU  Anesthesia Type:Spinal  Level of Consciousness: awake, alert  and oriented  Airway & Oxygen Therapy: Patient Spontanous Breathing  Post-op Assessment: Report given to RN and Post -op Vital signs reviewed and stable  Post vital signs: Reviewed  Last Vitals:  Filed Vitals:   05/10/15 0730 05/10/15 0731  BP:  88/40  Pulse: 78 76  Temp:    Resp: 19 22    Complications: No apparent anesthesia complications

## 2015-05-10 NOTE — Progress Notes (Signed)
CBG 98   05/10/2015 Lucia Bitter Kier Smead, RNC

## 2015-05-10 NOTE — H&P (Signed)
HPI: Victoria Christensen is a 30 y.o. year old 621P0000 female at 6738w0d weeks gestation who presents to MAU reporting Spontaneous rupture of membranes at 300430 and Labor. Breech at last appt. Has C/S scheduled for 39 weeks. A2GDM on Insulin. Suspected fetal macrosomia., Last US at 34 weeks EFW 3025 g, (>90%)   Clinic  [facility] Labs  Dating  U/S Blood type: --/--/B POS (08/17 1419)   Genetic Screen Quad:  01/09/15 - 1:107 Down's Syndrome risk -    NIPS: declined Antibody: Negative  Anatomic US  normal Rubella: Immune   GTT  Early - elevated RPR:   Negative  Flu vaccine  received at work 01/06/15  HBsAg:  Negative   TDaP vaccine   04/17/15 HIV: Non Reactive (08/17 1423)   Baby Food  breast                                             GBS:  positive  Contraception  undecided Pap: negative 11/2012  Circumcision  girl UDS: negative  Pediatrician    Support Person  Christopher (FOB)     Patient Active Problem List   Diagnosis Date Noted  . Breech presentation 04/24/2015  . Insufficient prenatal care in third trimester 04/10/2015  . Abnormal maternal serum screening test 02/02/2015  . Supervision of high risk pregnancy, antepartum 01/18/2015  . Gestational diabetes mellitus (GDM), antepartum 01/18/2015    Maternal Medical History:  Reason for admission: Rupture of membranes and contractions.     OB History    Gravida Para Term Preterm AB TAB SAB Ectopic Multiple Living   1 0 0 0 0 0 0 0 0 0      Past Medical History  Diagnosis Date  . PCOS (polycystic ovarian syndrome) 2016  . Anemia   . Chronic kidney disease     pyelonephritis  . Gestational diabetes    Past Surgical History  Procedure Laterality Date  . No past surgeries     Family History: family history includes Diabetes in her father; Kidney disease in her father. Social History:  reports that she has quit smoking. She has never used smokeless tobacco. She reports that she does not  drink alcohol or use illicit drugs.   Prenatal Transfer Tool  Maternal Diabetes: Yes:  Diabetes Type:  Insulin/Medication controlled Genetic Screening: Normal Maternal Ultrasounds/Referrals: Normal Fetal Ultrasounds or other Referrals:  None Maternal Substance Abuse:  No Significant Maternal Medications:  Meds include: Other:  Significant Maternal Lab Results:  Lab values include: Group B Strep positive Other Comments:  Insulin.   Review of Systems  Constitutional: Negative for fever and chills.  Eyes: Negative for blurred vision.  Gastrointestinal: Positive for abdominal pain (contractions). Negative for vomiting.       Pos LOF. Neg VB  Neurological: Negative for headaches.    Dilation: 5 Exam by:: VKatrinka Blazing. Smith CNM Blood pressure 131/93, pulse 85, temperature 97.9 F (36.6 C), temperature source Oral, resp. rate 18, height 5\' 2"  (1.575 m), weight 249 lb (112.946 kg), last menstrual period 08/14/2014. Maternal Exam:  Uterine Assessment: Contraction strength is firm.  Contraction frequency is regular.   Abdomen: Patient reports no abdominal tenderness. Estimated fetal weight is 9-8.   Fetal presentation: breech Possible cord presentation  Introitus: Vulva is negative for lesion.  Ferning test: positive.  Amniotic fluid character:  clear.  Pelvis: questionable for delivery.   Cervix: Cervix evaluated by sterile speculum exam and digital exam.   Pos pooling  Fetal Exam Fetal Monitor Review: Mode: ultrasound.   Baseline rate: 130.  Variability: moderate (6-25 bpm).   Pattern: no decelerations and no accelerations.    Fetal State Assessment: Category II - tracings are indeterminate.     Physical Exam  Nursing note and vitals reviewed. Constitutional: She is oriented to person, place, and time. She appears well-developed and well-nourished. No distress.  HENT:  Head: Atraumatic.  Eyes: Conjunctivae are normal.  Cardiovascular: Normal rate, regular rhythm and normal heart  sounds.   Respiratory: Effort normal and breath sounds normal. No respiratory distress.  GI: Soft. Bowel sounds are normal. There is no tenderness.  Genitourinary: Vagina normal. Vulva exhibits no lesion.  Pos pooling  Musculoskeletal: She exhibits edema. She exhibits no tenderness.  Neurological: She is alert and oriented to person, place, and time.  Skin: Skin is warm and dry.  Psychiatric: She has a normal mood and affect.    Breech confirmed by informal BS [place] and cervical exam  Prenatal labs: ABO, Rh: B/POS/-- (02/06 1009) Antibody: NEG (02/06 1009) Rubella: 1.63 (02/06 1009) RPR: NON REAC (02/06 1009)  HBsAg: NEGATIVE (02/06 1009)  HIV: Non Reactive (08/17 1423)  GBS:   Pos  Assessment: 1. Labor: active, SROM 2. Fetal Wellbeing: Category II  3. Pain Control: None 4. GBS: Pos 5. 37.0 week IUP 6. Breech w/ possible cord presentation  Plan:  1. Prep for OR 2. Ancef 3. Dr. Debroah Loop at [facility], [place] 05/10/2015, 5:37 AM  Attestation of Attending Supervision of Advanced Practitioner (CNM/NP/PA): Evaluation and management procedures were performed by the Advanced Practitioner under my supervision and collaboration. I have reviewed the Advanced Practitioner's note and chart, and I agree with the management and plan. The risks of cesarean section discussed with the patient included but were not limited to: bleeding which may require transfusion or reoperation; infection which may require antibiotics; injury to bowel, bladder, ureters or other surrounding organs; injury to the fetus; need for additional procedures including hysterectomy in the event of a life-threatening hemorrhage; placental abnormalities wth subsequent pregnancies, incisional problems, thromboembolic phenomenon and other postoperative/anesthesia complications. The patient concurred with the proposed plan, giving informed written consent for the procedure.   Patient has been NPO since 2330 she will remain NPO for  procedure. Anesthesia and OR aware. Preoperative prophylactic antibiotics and SCDs ordered on call to the OR.  To OR when ready.    Scheryl Darter MD

## 2015-05-10 NOTE — Addendum Note (Signed)
Addendum  created 05/10/15 1556 by Yolonda KidaAlison L Trachelle Low, CRNA   Modules edited: Clinical Notes   Clinical Notes:  File: [id number]

## 2015-05-10 NOTE — Consult Note (Signed)
Neonatology Note:   Attendance at C-section:    I was asked by Dr. Arnold to attend this C/S at term for breech presentation. The mother is a 29yo G1 female at [redacted]w[redacted]d, GBS positive. Ancef x 1 <1hr PTD.  No maternal fever or chorio concerns. Pregnancy also complicated by diabetes requiring insulin.  ROM  2hrs before delivery, fluid clear. Infant vigorous with good spontaneous cry and tone. Needed only minimal bulb suctioning. Ap 8/9. Lungs clear to ausc in DR. To CN to care of Pediatrician.  David Ehrmann, MD 

## 2015-05-10 NOTE — MAU Note (Signed)
Pt here with c/o rupture of membranes and contractions. Was 2 cm and breech on Monday. Has GDM, on insulin.

## 2015-05-10 NOTE — Op Note (Signed)
Cesarean Section Procedure Note   Victoria Christensen  05/10/2015  Indications: Breech Presentation   Pre-operative Diagnosis: primary cesarean section for breech, labor, ruptured membranes.   Post-operative Diagnosis: Same   Surgeon: Surgeon(s) and Role:    * Adam PhenixJames G Darrelyn Morro, MD - Primary   Assistants: none  Anesthesia: spinal   Procedure Details:  The patient was seen in the Holding Room. The risks, benefits, complications, treatment options, and expected outcomes were discussed with the patient. The patient concurred with the proposed plan, giving informed consent. identified as Victoria Christensen and the procedure verified as C-Section Delivery. A Time Out was held and the above information confirmed.  After induction of anesthesia, the patient was draped and prepped in the usual sterile manner. A transverse was made and carried down through the subcutaneous tissue to the fascia. Fascial incision was made and extended transversely. The fascia was separated from the underlying rectus tissue superiorly and inferiorly. The peritoneum was identified and entered. Peritoneal incision was extended longitudinally. The utero-vesical peritoneal reflection was incised transversely and the bladder flap was bluntly freed from the lower uterine segment. A low transverse uterine incision was made. Delivered from breech complete presentation was a Female with Apgar scores of 8 at one minute and 9 at five minutes. Cord ph was not sent the umbilical cord was clamped and cut cord blood was obtained for evaluation. The placenta was removed Intact and appeared normal. The uterine outline, tubes and ovaries appeared normal}. The uterine incision was closed with running locked sutures of 0Vicryl and a second layer followed    The fascia was then reapproximated with running sutures of 0Vicryl. The subcuticular closure was performed using 2-0plain gut. The skin was closed with 4-0Vicryl.   Instrument, sponge, and needle  counts were correct prior the abdominal closure and were correct at the conclusion of the case.    Findings:live born female in breech presentation   Estimated Blood Loss: 700 ml  Total IV Fluids: 1600 ml   Urine Output: 100 CC OF clear urine  Specimens: @ORSPECIMEN @   Complications: no complications  Disposition: PACU - hemodynamically stable.   Maternal Condition: stable   Baby condition / location:  Couplet care / Skin to Skin  Attending Attestation: I was present and scrubbed for the entire procedure.   Signed: Surgeon(s): Adam PhenixJames G Atlas Crossland, MD   05/10/2015 7:16 AM '

## 2015-05-10 NOTE — Anesthesia Postprocedure Evaluation (Signed)
Anesthesia Post Note  Patient: Victoria Christensen  Procedure(s) Performed: Procedure(s) (LRB): CESAREAN SECTION (N/A)  Patient location during evaluation: Mother Baby Anesthesia Type: Spinal Level of consciousness: awake, awake and alert, oriented and patient cooperative Pain management: pain level controlled Vital Signs Assessment: post-procedure vital signs reviewed and stable Respiratory status: spontaneous breathing, nonlabored ventilation and respiratory function stable Cardiovascular status: stable Postop Assessment: no headache, no backache, patient able to bend at knees and no signs of nausea or vomiting Anesthetic complications: no    Last Vitals:  Filed Vitals:   05/10/15 1055 05/10/15 1300  BP: 113/56   Pulse: 64   Temp: 36.8 C 37.1 C  Resp: 20 20    Last Pain:  Filed Vitals:   05/10/15 1346  PainSc: 0-No pain                 Thong Feeny L

## 2015-05-11 ENCOUNTER — Encounter (HOSPITAL_COMMUNITY): Payer: Self-pay | Admitting: Obstetrics & Gynecology

## 2015-05-11 DIAGNOSIS — Z3490 Encounter for supervision of normal pregnancy, unspecified, unspecified trimester: Secondary | ICD-10-CM

## 2015-05-11 LAB — CBC
HEMATOCRIT: 31.2 % — AB (ref 36.0–46.0)
HEMOGLOBIN: 10 g/dL — AB (ref 12.0–15.0)
MCH: 25.6 pg — ABNORMAL LOW (ref 26.0–34.0)
MCHC: 32.1 g/dL (ref 30.0–36.0)
MCV: 79.8 fL (ref 78.0–100.0)
Platelets: 101 10*3/uL — ABNORMAL LOW (ref 150–400)
RBC: 3.91 MIL/uL (ref 3.87–5.11)
RDW: 14.5 % (ref 11.5–15.5)
WBC: 10.2 10*3/uL (ref 4.0–10.5)

## 2015-05-11 LAB — GLUCOSE, CAPILLARY
GLUCOSE-CAPILLARY: 123 mg/dL — AB (ref 65–99)
GLUCOSE-CAPILLARY: 120 mg/dL — AB (ref 65–99)
Glucose-Capillary: 123 mg/dL — ABNORMAL HIGH (ref 65–99)

## 2015-05-11 MED ORDER — OXYCODONE-ACETAMINOPHEN 5-325 MG PO TABS
1.0000 | ORAL_TABLET | ORAL | Status: DC | PRN
  Administered 2015-05-11 (×2): 1 via ORAL
  Administered 2015-05-12: 2 via ORAL
  Administered 2015-05-12: 1 via ORAL
  Administered 2015-05-13: 2 via ORAL
  Filled 2015-05-11 (×2): qty 1
  Filled 2015-05-11: qty 2
  Filled 2015-05-11: qty 1
  Filled 2015-05-11: qty 2

## 2015-05-11 NOTE — Lactation Note (Signed)
This note was copied from a baby's chart. Lactation Consultation Note  Patient Name: [name] ZOXWR'UToday's Date: 05/11/2015   Visited with Mom, baby 7832 hrs old.  Double phototherapy started 2 hrs ago, and Mom holding baby on edge of bed.  LC transferred baby to her crib stating it would be safer to place baby in crib, if she became sleepy.  Offered assistance with latching baby, but Mom declined presently, encouraged to ask at next feeding.  Talked about pumping to support her milk supply, along with manual breast expression and massage.  Talked about offering baby slow flow bottles at next supplement, rather than syringe as supplementation will likely be needed for a few days.  Mom agreeable.  Set up Mom to double pump, and encouraged her to pump every 3 hrs.  To follow up in am, or prn as needed for latch help.    Judee ClaraSmith, Jovanni Rash E 05/11/2015, 3:08 PM

## 2015-05-11 NOTE — Progress Notes (Signed)
Patient ID: Victoria Christensen, female   DOB: 10/20/1985, 30 y.o.   MRN: [id number]  Subjective: Victoria Christensen is a POD#1 30 y.o. G1P1001 s/p PLTCS (breech) complicated by gestational diabetes mellitus and PCOS. Patient has no complaints this morning. Pain is quantified as 3/10 now after receiving ibuprofen at 0615. Decrease in vaginal bleeding with minimal lochia. Patient reports to have not eaten since 830PM last night. Morning glucose 123, but patient was informed not to take her insulin. Has ambulated, urinated, passed flatus, but has yet to pass a BM. Currently breastfeeding and bottle feeding.  Objective:  Filed Vitals:   05/11/15 0052 05/11/15 0430  BP: 112/58 131/75  Pulse: 79 101  Temp: 98 F (36.7 C) 98.1 F (36.7 C)  Resp: 20 20   I/O = +205 mL  Hgb: 11.8>10 Hct: 36.6>31.2  Morning glucose - 123 mg/dL  Medication: Ibuprofen 600 mg at 1AM and 615AM. Senokot 80 mg at 1AM and Simethocone 80 mg at 8AM.  A/P  Elevated blood glucose - monitor glucose levels throughout stay and postpartum visits. Begin metformin if glucose levels do not normalize.  CNM attestation Post Partum Day #1   Victoria Christensen is a 30 y.o. G1P1001 s/p pLTCS.  Pt denies problems with ambulating, voiding or po intake. Pain is well controlled.  Plan for birth control is Nexplanon.  Method of Feeding: breast  PE:  BP 131/75 mmHg  Pulse 101  Temp(Src) 98.1 F (36.7 C) (Oral)  Resp 20  Ht 5\' 2"  (1.575 m)  Wt 112.946 kg (249 lb)  BMI 45.53 kg/m2  SpO2 97%  LMP 08/14/2014 (Approximate)  Breastfeeding? Unknown Fundus firm  Plan for discharge: 05/12/15 Will get fasting and 2hr PP CBGs today  Cam HaiSHAW, Evania Lyne, CNM 9:03 AM  05/11/2015

## 2015-05-11 NOTE — Progress Notes (Signed)
UR chart review completed.  

## 2015-05-12 ENCOUNTER — Other Ambulatory Visit: Payer: Medicaid Other

## 2015-05-12 LAB — GLUCOSE, CAPILLARY: GLUCOSE-CAPILLARY: 102 mg/dL — AB (ref 65–99)

## 2015-05-12 MED ORDER — METFORMIN HCL ER 500 MG PO TB24
1000.0000 mg | ORAL_TABLET | Freq: Every day | ORAL | Status: DC
  Administered 2015-05-12 – 2015-05-13 (×2): 1000 mg via ORAL
  Filled 2015-05-12 (×3): qty 2

## 2015-05-12 NOTE — Lactation Note (Signed)
This note was copied from a baby's chart. Lactation Consultation Note follow up visit at 64 hours of age.  Mom is tearful regarding baby in NICU.  Mom plans to pump, but has not today and is not eager at this time.  DEBP set up in room at bedside.   Encouraged mom to pump 8 x/24 hours or every 2-3 hours.  Encouraged mom to rest during the night if she can.  Mom to hand express after pumping to collect EBM to give to baby.  NICU booklet given and discussed.  Mom aware of NICU lactation services.  Mom to call for asssist as needed.   Patient Name: Victoria Achilles DunkDaquasha Mcclory MWUXL'KToday's Date: 05/12/2015 Reason for consult: Follow-up assessment;NICU baby;Hyperbilirubinemia   Maternal Data Has patient been taught Hand Expression?: Yes Does the patient have breastfeeding experience prior to this delivery?: No  Feeding Feeding Type: Bottle Fed - Formula Nipple Type: Slow - flow  LATCH Score/Interventions                      Lactation Tools Discussed/Used     Consult Status Consult Status: Follow-up Date: 05/13/15 Follow-up type: In-patient    Victoria Christensen, Victoria Christensen 05/12/2015, 11:20 PM

## 2015-05-12 NOTE — Progress Notes (Signed)
Patient ID: Victoria Christensen, female   DOB: June 29, 1985, 30 y.o.   MRN: [id number]  Subjective: Victoria CoryDaquasha A Jayson is a POD#2 30 y.o. G1P1001 s/p PLTCS (breech) complicated by gestational diabetes mellitus and PCOS. Patient has no complaints this morning. No pain now after receiving ibuprofen at 0530. Decrease in vaginal bleeding. Fasting glucose 123 on 3/9 at 0700 and had repeat measurements of 120 mg/dl and 657123 mg/dl at [id number] and [id number], respectively. Has ambulated, urinated, passed flatus, but has yet to pass a BM. Baby was receiving UV therapy for jaundice. Plans on formula feeding. Requesting nexplanon. Objective:  Filed Vitals:   05/11/15 1810 05/12/15 0527  BP: 118/69 100/82  Pulse: 79 83  Temp: 98.3 F (36.8 C) 97.9 F (36.6 C)  Resp: 18 20       I/O = -500 mL  CBG (last 3)   Recent Labs  05/11/15 0705 05/11/15 1206 05/11/15 1846  GLUCAP 123* 120* 123*    Medication: Ibuprofen 600 mg at 0530 AM.  A/P  Elevated blood glucose - continue metformin use Remain in hospital for baby's phototherapy

## 2015-05-12 NOTE — Lactation Note (Signed)
This note was copied from a baby's chart. Lactation Consultation Note: Infant has been under double photo tx. Mother has been mostly bottle feeding. She states that she has been breastfeeding infant once daily for 15 mins and pumping  3 times daily. Reviewed importance of and benefits of post pumping. Mother advised to pump every 2-3 hours for 15-20 mins. Mother was given a hand pump with instructions. Mother states she is active with WIC. [facility]. Lots of teaching.   Patient Name: Victoria Christensen ZOXWR'UToday's Date: 05/12/2015 Reason for consult: Follow-up assessment   Maternal Data    Feeding Feeding Type: Bottle Fed - Formula  LATCH Score/Interventions                      Lactation Tools Discussed/Used     Consult Status Consult Status: Follow-up Date: 05/12/15 Follow-up type: In-patient    Stevan BornKendrick, Victoria Christensen 05/12/2015, 3:46 PM

## 2015-05-13 MED ORDER — OXYCODONE-ACETAMINOPHEN 5-325 MG PO TABS: 1.0000 | ORAL_TABLET | ORAL | Status: DC | PRN

## 2015-05-13 NOTE — Lactation Note (Signed)
This note was copied from a baby's chart. Lactation Consultation Note  Mother has pumped twice in the past 24 hours as reports that it is related to being overwhelmed by infant's NICU admission. [facility] and plans to use a hand pump while at home and the symphony that is in the NICU when she visits the baby. Reviewed with mother that her breast milk is medicine for the baby and that even drops are important.  WIC referral sent to [place] [facility]. Patient Name: Victoria Christensen WUJWJ'XToday's Date: 05/13/2015     Maternal Data    Feeding Feeding Type: Formula Nipple Type: Slow - flow Length of feed: 20 min  LATCH Score/Interventions                      Lactation Tools Discussed/Used     Consult Status      Soyla DryerJoseph, Victoria Christensen 05/13/2015, 9:24 AM

## 2015-05-13 NOTE — Discharge Summary (Signed)
OB Discharge Summary     Patient Name: Victoria Christensen DOB: Dec 27, 1985 MRN: [id number]  Date of admission: 05/10/2015 Delivering MD: Adam PhenixARNOLD, JAMES G   Date of discharge: 05/13/2015  Admitting diagnosis: 40 WEEKS ROM CTX Intrauterine pregnancy: 4272w0d     Secondary diagnosis:  Active Problems:   Breech presentation   Pregnancy  Additional problems:  Patient Active Problem List   Diagnosis Date Noted  . Pregnancy 05/11/2015  . Breech presentation 04/24/2015  . Insufficient prenatal care in third trimester 04/10/2015  . Abnormal maternal serum screening test 02/02/2015  . Supervision of high risk pregnancy, antepartum 01/18/2015  . Gestational diabetes mellitus (GDM), antepartum 01/18/2015       Discharge diagnosis: Term Pregnancy Delivered                                                                                                Post partum procedures:none  Augmentation: none  Complications: None  Hospital course:  Onset of Labor With Unplanned C/S  30 y.o. yo G1P1001 at 3872w0d was admitted in Active Labor on 05/10/2015.  Membrane Rupture Time/Date: 4:30 AM ,05/10/2015   The patient went for cesarean section due to Malpresentation (breech), and delivered a Viable infant,05/10/2015  Details of operation can be found in separate operative note. Patient had an uncomplicated postpartum course.  She is ambulating,tolerating a regular diet, passing flatus, and urinating well.  Patient is discharged home in stable condition 05/13/2015.  Physical exam  Filed Vitals:   05/11/15 1810 05/12/15 0527 05/12/15 1936 05/13/15 0623  BP:  100/82 133/62 118/75  Pulse: 79 83 85 101  Temp: 98.3 F (36.8 C) 97.9 F (36.6 C) 97.9 F (36.6 C) 98.2 F (36.8 C)  TempSrc: Oral  Oral   Resp: 18 20 18 20   Height:      Weight:      SpO2:   100%    General: alert, cooperative and no distress Lochia: appropriate Uterine Fundus: firm Incision: Healing well with no significant drainage, No  significant erythema, Dressing is clean, dry, and intact DVT Evaluation: Negative Homan's sign. No cords or calf tenderness. Calf/Ankle edema is present Labs: Lab Results  Component Value Date   WBC 10.2 05/11/2015   HGB 10.0* 05/11/2015   HCT 31.2* 05/11/2015   MCV 79.8 05/11/2015   PLT 101* 05/11/2015   CMP Latest Ref Rng 04/10/2015  Glucose 65 - 99 mg/dL 409(W133(H)  BUN 7 - 25 mg/dL 3(L)  Creatinine 1.190.50 - 1.10 mg/dL 1.470.62  Sodium 829135 - 562146 mmol/L 136  Potassium 3.5 - 5.3 mmol/L 3.9  Chloride 98 - 110 mmol/L 104  CO2 20 - 31 mmol/L 23  Calcium 8.6 - 10.2 mg/dL 9.9  Total Protein 6.1 - 8.1 g/dL 6.3  Total Bilirubin 0.2 - 1.2 mg/dL 0.3  Alkaline Phos 33 - 115 U/L 172(H)  AST 10 - 30 U/L 13  ALT 6 - 29 U/L 10    Discharge instruction: per After Visit Summary and "Baby and Me Booklet".  After visit meds:    Medication List    STOP taking these medications  ACCU-CHEK FASTCLIX LANCETS Misc     aspirin EC 81 MG tablet     cyclobenzaprine 5 MG tablet  Commonly known as:  FLEXERIL     glucose blood test strip  Commonly known as:  ACCU-CHEK AVIVA     insulin NPH Human 100 UNIT/ML injection  Commonly known as:  HUMULIN N,NOVOLIN N     insulin regular 100 units/mL injection  Commonly known as:  HUMULIN R     INSULIN SYRINGE 1CC/29G 29G X 1/2" 1 ML Misc  Commonly known as:  B-D INSULIN SYRINGE     prenatal multivitamin Tabs tablet      TAKE these medications        metFORMIN 500 MG tablet  Commonly known as:  GLUCOPHAGE  Take 500 mg by mouth 2 (two) times daily with a meal. Reported on 04/17/2015     oxyCODONE-acetaminophen 5-325 MG tablet  Commonly known as:  PERCOCET/ROXICET  Take 1-2 tablets by mouth every 4 (four) hours as needed (Give 1 tab if pain <7/10.  Give 2 tab if pain >7.).        Diet: routine diet  Activity: Advance as tolerated. Pelvic rest for 6 weeks.   Outpatient follow up:2 weeks Follow up Appt:Future Appointments Date Time Provider  Department Center  05/19/2015 8:30 AM [facility] [place] 2 [facility]-US 203  06/22/2015 12:45 PM Adam Phenix, MD [facility]   Follow up Visit:No Follow-up on file.  Postpartum contraception: Nexplanon  Newborn Data: Live born female  Birth Weight: 8 lb 0.8 oz (3650 g) APGAR: 8, 9  Baby Feeding: Bottle Disposition:NICU   05/13/2015 Beaulah Dinning, MD   OB FELLOW DISCHARGE ATTESTATION  I have seen and examined this patient and agree with above documentation in the resident's note.   Silvano Bilis, MD 7:13 PM

## 2015-05-13 NOTE — Discharge Instructions (Signed)

## 2015-05-15 ENCOUNTER — Other Ambulatory Visit: Payer: Medicaid Other

## 2015-05-18 ENCOUNTER — Ambulatory Visit: Payer: Self-pay

## 2015-05-18 NOTE — Lactation Note (Signed)
This note was copied from a baby's chart. Lactation Consultation Note  Mom has been pumping with a hand pump but received a DEBP from [facility] yesterday.  Mom states she doesn't have all the pieces for the DEBP so she was given a new kit.  Pumping 20-25 mls.  Stressed importance of pumping at least 8 times in 24 hours to establish a good milk supply.  Encouraged to call with concerns/assist prn.  Patient Name: Victoria Christensen [id number] Date: 05/18/2015     Maternal Data    Feeding Feeding Type: Formula Nipple Type: Regular Length of feed: 45 min (PO fdg x 10/NG x 35)  LATCH Score/Interventions                      Lactation Tools Discussed/Used     Consult Status      Ave Filter 05/18/2015, 4:00 PM

## 2015-05-19 ENCOUNTER — Ambulatory Visit (HOSPITAL_COMMUNITY)
Admission: RE | Admit: 2015-05-19 | Discharge: 2015-05-19 | Disposition: A | Discharge status: Home / Self Care | Payer: Medicaid Other | Source: Ambulatory Visit | Attending: Family Medicine | Admitting: Family Medicine

## 2015-05-19 ENCOUNTER — Other Ambulatory Visit: Payer: Medicaid Other

## 2015-05-19 DIAGNOSIS — O24419 Gestational diabetes mellitus in pregnancy, unspecified control: Secondary | ICD-10-CM

## 2015-05-22 NOTE — Telephone Encounter (Signed)
Left Message-  Advised patient decreased medication dosing. Pay close attention to instructions on medication bottle. We will see you back on friday to evaluate increase of medication.

## 2015-05-26 ENCOUNTER — Encounter (HOSPITAL_COMMUNITY): Admission: RE | Payer: Self-pay | Source: Ambulatory Visit

## 2015-05-26 ENCOUNTER — Inpatient Hospital Stay (HOSPITAL_COMMUNITY)
Admission: RE | Admit: 2015-05-26 | Payer: Medicaid Other | Source: Ambulatory Visit | Admitting: Obstetrics & Gynecology

## 2015-05-26 SURGERY — Surgical Case
Anesthesia: Regional | Site: Abdomen

## 2015-06-22 ENCOUNTER — Encounter: Payer: Self-pay | Admitting: Obstetrics & Gynecology

## 2015-06-22 ENCOUNTER — Ambulatory Visit (INDEPENDENT_AMBULATORY_CARE_PROVIDER_SITE_OTHER): Payer: Medicaid Other | Admitting: Obstetrics & Gynecology

## 2015-06-22 DIAGNOSIS — E119 Type 2 diabetes mellitus without complications: Secondary | ICD-10-CM | POA: Insufficient documentation

## 2015-06-22 NOTE — Progress Notes (Signed)
Progress Notes   Victoria CoryDaquasha A Dibert (MR# [id number])      Progress Notes Info    Author Note Status Last Update User Last Update Date/Time   Caro Hightara Kerah Hardebeck, MS3 Sign at close encounter Demetrice Cheree DittoGraham, ArizonaRMA 06/22/2015 1:18 PM    Progress Notes    Expand All Collapse All   Patient ID: Victoria Christensen, female DOB: June 16, 1985, 29 y.o. MRN: [id number] Subjective:    Victoria Christensen is a 30 y.o. female who presents for a postpartum visit. She is six weeks postpartum following a 05/10/2015 primary lateral transverse c-section due to breech presentation after ruptured membranes. I have fully reviewed the prenatal and intrapartum course. The delivery was at 37 gestational weeks and complicated by pre-gestational diabetes requiring insulin. Outcome: C-section. Anesthesia: Spinal . Postpartum course has been completed. Baby's course has been completed. Baby is feeding by bottle. Bleeding not at this time. Bowel function is normal. Bladder function is normal. Patient is not sexually active. Contraception method is nothing at this time. Patient plans to use condoms when she resumes sexual activity. Postpartum depression screening: negative  Review of Systems ROS completed, negative   Objective:    BP 118/70 mmHg  Pulse 73  Wt 227 lb 8 oz (103.193 kg)  General:  Well appearing, no distress   Lungs: CTAB, no increased work of breathing  Heart:  RRR, normal S1S2, no murmur appreciated   Abdomen: Soft, non-distended, non-tender. Incision is clean and dry, no erythema or exudate.       Assessment:  Victoria Christensen is a 29yo G1P1001 here for 6 week post-partum check after pLTCS on 3/8 at 37 weeks. Her postpartum exam is benign and her incision is healing well. She has no complaints and no signs of post-partum depression. Pap smear not done at today's visit.   Plan:   1. Contraception: condoms. Patient counseled on options  2. Type 2 DM: currently managed with metformin, will f/u  with PCP in July 3. Follow up as needed.         Attestation of Attending Supervision of medical student: Evaluation and management procedures were performed by the student under my supervision and collaboration.  I have reviewed the resident's note and chart, and I agree with the management and plan.  Scheryl DarterJAMES ARNOLD, MD, FACOG Attending Obstetrician & Gynecologist Faculty Practice, [facility] [place]

## 2015-06-22 NOTE — Progress Notes (Signed)
Patient ID: Victoria Christensen, female   DOB: [birth year], 30 y.o.   MRN: [id number] Subjective:     Victoria Christensen is a 30 y.o. female who presents for a postpartum visit. She is six weeks postpartum following a 05/10/2015. I have fully reviewed the prenatal and intrapartum course. The delivery was at 38 gestational weeks. Outcome: vaginal . Anesthesia: Epidural . Postpartum course has been completed. Baby's course has been completed. Baby is feeding by bottle. Bleeding not at this time. Bowel function is normal. Bladder function is normal. Patient is not sexually active. Contraception method is nothing at this time. Postpartum depression screening: negative See medical student note   1. Contraception: condoms  2. Advised re IUD, Nexplanon 3. Follow up as needed.    Adam PhenixJames G Arnold, MD 06/22/2015

## 2015-06-22 NOTE — Patient Instructions (Signed)
Intrauterine Device Information An intrauterine device (IUD) is inserted into your uterus to prevent pregnancy. There are two types of IUDs available:   Copper IUD--This type of IUD is wrapped in copper wire and is placed inside the uterus. Copper makes the uterus and fallopian tubes produce a fluid that kills sperm. The copper IUD can stay in place for 10 years.  Hormone IUD--This type of IUD contains the hormone progestin (synthetic progesterone). The hormone thickens the cervical mucus and prevents sperm from entering the uterus. It also thins the uterine lining to prevent implantation of a fertilized egg. The hormone can weaken or kill the sperm that get into the uterus. One type of hormone IUD can stay in place for 5 years, and another type can stay in place for 3 years. Your health care provider will make sure you are a good candidate for a contraceptive IUD. Discuss with your health care provider the possible side effects.  ADVANTAGES OF AN INTRAUTERINE DEVICE  IUDs are highly effective, reversible, long acting, and low maintenance.   There are no estrogen-related side effects.   An IUD can be used when breastfeeding.   IUDs are not associated with weight gain.   The copper IUD works immediately after insertion.   The hormone IUD works right away if inserted within 7 days of your period starting. You will need to use a backup method of birth control for 7 days if the hormone IUD is inserted at any other time in your cycle.  The copper IUD does not interfere with your female hormones.   The hormone IUD can make heavy menstrual periods lighter and decrease cramping.   The hormone IUD can be used for 3 or 5 years.   The copper IUD can be used for 10 years. DISADVANTAGES OF AN INTRAUTERINE DEVICE  The hormone IUD can be associated with irregular bleeding patterns.   The copper IUD can make your menstrual flow heavier and more painful.   You may experience cramping and  vaginal bleeding after insertion.    This information is not intended to replace advice given to you by your health care provider. Make sure you discuss any questions you have with your health care provider.   Document Released: 01/23/2004 Document Revised: 10/21/2012 Document Reviewed: 08/09/2012 Elsevier Interactive Patient Education 2016 Elsevier Inc. Etonogestrel implant What is this medicine? ETONOGESTREL (et oh noe JES trel) is a contraceptive (birth control) device. It is used to prevent pregnancy. It can be used for up to 3 years. This medicine may be used for other purposes; ask your health care provider or pharmacist if you have questions. What should I tell my health care provider before I take this medicine? They need to know if you have any of these conditions: -abnormal vaginal bleeding -blood vessel disease or blood clots -cancer of the breast, cervix, or liver -depression -diabetes -gallbladder disease -headaches -heart disease or recent heart attack -high blood pressure -high cholesterol -kidney disease -liver disease -renal disease -seizures -tobacco smoker -an unusual or allergic reaction to etonogestrel, other hormones, anesthetics or antiseptics, medicines, foods, dyes, or preservatives -pregnant or trying to get pregnant -breast-feeding How should I use this medicine? This device is inserted just under the skin on the inner side of your upper arm by a health care professional. Talk to your pediatrician regarding the use of this medicine in children. Special care may be needed. Overdosage: If you think you have taken too much of this medicine contact a poison control   center or emergency room at once. NOTE: This medicine is only for you. Do not share this medicine with others. What if I miss a dose? This does not apply. What may interact with this medicine? Do not take this medicine with any of the following  medications: -amprenavir -bosentan -fosamprenavir This medicine may also interact with the following medications: -barbiturate medicines for inducing sleep or treating seizures -certain medicines for fungal infections like ketoconazole and itraconazole -griseofulvin -medicines to treat seizures like carbamazepine, felbamate, oxcarbazepine, phenytoin, topiramate -modafinil -phenylbutazone -rifampin -some medicines to treat HIV infection like atazanavir, indinavir, lopinavir, nelfinavir, tipranavir, ritonavir -St. John's wort This list may not describe all possible interactions. Give your health care provider a list of all the medicines, herbs, non-prescription drugs, or dietary supplements you use. Also tell them if you smoke, drink alcohol, or use illegal drugs. Some items may interact with your medicine. What should I watch for while using this medicine? This product does not protect you against HIV infection (AIDS) or other sexually transmitted diseases. You should be able to feel the implant by pressing your fingertips over the skin where it was inserted. Contact your doctor if you cannot feel the implant, and use a non-hormonal birth control method (such as condoms) until your doctor confirms that the implant is in place. If you feel that the implant may have broken or become bent while in your arm, contact your healthcare provider. What side effects may I notice from receiving this medicine? Side effects that you should report to your doctor or health care professional as soon as possible: -allergic reactions like skin rash, itching or hives, swelling of the face, lips, or tongue -breast lumps -changes in emotions or moods -depressed mood -heavy or prolonged menstrual bleeding -pain, irritation, swelling, or bruising at the insertion site -scar at site of insertion -signs of infection at the insertion site such as fever, and skin redness, pain or discharge -signs of pregnancy -signs  and symptoms of a blood clot such as breathing problems; changes in vision; chest pain; severe, sudden headache; pain, swelling, warmth in the leg; trouble speaking; sudden numbness or weakness of the face, arm or leg -signs and symptoms of liver injury like dark yellow or brown urine; general ill feeling or flu-like symptoms; light-colored stools; loss of appetite; nausea; right upper belly pain; unusually weak or tired; yellowing of the eyes or skin -unusual vaginal bleeding, discharge -signs and symptoms of a stroke like changes in vision; confusion; trouble speaking or understanding; severe headaches; sudden numbness or weakness of the face, arm or leg; trouble walking; dizziness; loss of balance or coordination Side effects that usually do not require medical attention (Report these to your doctor or health care professional if they continue or are bothersome.): -acne -back pain -breast pain -changes in weight -dizziness -general ill feeling or flu-like symptoms -headache -irregular menstrual bleeding -nausea -sore throat -vaginal irritation or inflammation This list may not describe all possible side effects. Call your doctor for medical advice about side effects. You may report side effects to FDA at 1-800-FDA-1088. Where should I keep my medicine? This drug is given in a hospital or clinic and will not be stored at home. NOTE: This sheet is a summary. It may not cover all possible information. If you have questions about this medicine, talk to your doctor, pharmacist, or health care provider.    2016, Elsevier/Gold Standard. (2013-12-03 14:07:06)

## 2016-12-29 ENCOUNTER — Emergency Department (HOSPITAL_BASED_OUTPATIENT_CLINIC_OR_DEPARTMENT_OTHER)
Admission: EM | Admit: 2016-12-29 | Discharge: 2016-12-29 | Disposition: A | Discharge status: Home / Self Care | Payer: Medicaid Other | Attending: Physician Assistant | Admitting: Physician Assistant

## 2016-12-29 ENCOUNTER — Encounter (HOSPITAL_BASED_OUTPATIENT_CLINIC_OR_DEPARTMENT_OTHER): Payer: Self-pay | Admitting: *Deleted

## 2016-12-29 DIAGNOSIS — L03019 Cellulitis of unspecified finger: Secondary | ICD-10-CM

## 2016-12-29 DIAGNOSIS — R2231 Localized swelling, mass and lump, right upper limb: Secondary | ICD-10-CM | POA: Diagnosis present

## 2016-12-29 DIAGNOSIS — E1122 Type 2 diabetes mellitus with diabetic chronic kidney disease: Secondary | ICD-10-CM | POA: Diagnosis not present

## 2016-12-29 DIAGNOSIS — Z7984 Long term (current) use of oral hypoglycemic drugs: Secondary | ICD-10-CM | POA: Insufficient documentation

## 2016-12-29 DIAGNOSIS — L03011 Cellulitis of right finger: Secondary | ICD-10-CM | POA: Diagnosis not present

## 2016-12-29 DIAGNOSIS — N189 Chronic kidney disease, unspecified: Secondary | ICD-10-CM | POA: Diagnosis not present

## 2016-12-29 DIAGNOSIS — Z87891 Personal history of nicotine dependence: Secondary | ICD-10-CM | POA: Diagnosis not present

## 2016-12-29 MED ORDER — LIDOCAINE HCL (PF) 1 % IJ SOLN: 5.0000 mL | Freq: Once | INTRAMUSCULAR | Status: DC

## 2016-12-29 MED ORDER — IBUPROFEN 800 MG PO TABS: 800.0000 mg | ORAL_TABLET | Freq: Three times a day (TID) | ORAL | 0 refills | Status: DC

## 2016-12-29 MED ORDER — CEPHALEXIN 500 MG PO CAPS: 500.0000 mg | ORAL_CAPSULE | Freq: Four times a day (QID) | ORAL | 0 refills | Status: DC

## 2016-12-29 MED ORDER — LIDOCAINE HCL 1 % IJ SOLN
INTRAMUSCULAR | Status: AC
  Administered 2016-12-29: 20 mL
  Filled 2016-12-29: qty 20

## 2016-12-29 MED ORDER — CEPHALEXIN 250 MG PO CAPS
500.0000 mg | ORAL_CAPSULE | Freq: Once | ORAL | Status: AC
  Administered 2016-12-29: 500 mg via ORAL
  Filled 2016-12-29: qty 2

## 2016-12-29 NOTE — ED Triage Notes (Signed)
C/o pain to right  pinky finger that started on Monday. Denies any injury. Unsure if she injured finger. On exam swelling noted to tip of her right pinky. Describes pain as throbbing. Denies any fevers. Bruising  and redness also noted.

## 2016-12-29 NOTE — ED Provider Notes (Signed)
[facility] EMERGENCY DEPARTMENT Provider Note   CSN: [id number] Arrival date & time: 12/29/16  0321     History   Chief Complaint No chief complaint on file.   HPI Victoria Christensen is a 31 y.o. female.  HPI   31 year old female presenting with swelling and erythema to the right pinky.  Patient reports that it started Monday.  Denies any injury to the area.  Past Medical History:  Diagnosis Date  . Anemia   . Chronic kidney disease    pyelonephritis  . Former smoker   . Gestational diabetes    insulin dependent  . PCOS (polycystic ovarian syndrome) 2016    Patient Active Problem List   Diagnosis Date Noted  . Type 2 diabetes mellitus (HCC) 06/22/2015  . Obesity, Class III, BMI 40-49.9 (morbid obesity) (HCC) 06/22/2015    Past Surgical History:  Procedure Laterality Date  . CESAREAN SECTION N/A 05/10/2015   Procedure: CESAREAN SECTION;  Surgeon: Adam PhenixJames G Arnold, MD;  Location: [facility];  Service: Obstetrics;  Laterality: N/A;  . NO PAST SURGERIES      OB History    Gravida Para Term Preterm AB Living   1 1 1  0 0 1   SAB TAB Ectopic Multiple Live Births   0 0 0 0 1       Home Medications    Prior to Admission medications   Medication Sig Start Date End Date Taking? Authorizing Provider  metFORMIN (GLUCOPHAGE) 500 MG tablet Take 500 mg by mouth 2 (two) times daily with a meal. Reported on 04/17/2015    [provider]  oxyCODONE-acetaminophen (PERCOCET/ROXICET) 5-325 MG tablet Take 1-2 tablets by mouth every 4 (four) hours as needed (Give 1 tab if pain <7/10.  Give 2 tab if pain >7.). 05/13/15   Beaulah DinningGambino, Christina M, MD    Family History Family History  Problem Relation Age of Onset  . Diabetes Father   . Kidney disease Father     Social History Social History  Substance Use Topics  . Smoking status: Former Games developermoker  . Smokeless tobacco: Never Used  . Alcohol use No     Comment: socially when not pregnant      Allergies     Patient has no known allergies.   Review of Systems Review of Systems  Constitutional: Negative for activity change.  Respiratory: Negative for shortness of breath.   Cardiovascular: Negative for chest pain.  Gastrointestinal: Negative for abdominal pain.     Physical Exam Updated Vital Signs BP (!) 146/82   Pulse 76   Temp 98.3 F (36.8 C)   Resp 18   Ht 5\' 2"  (1.575 m)   Wt 104.3 kg (230 lb)   LMP 12/06/2016 (Exact Date)   SpO2 98%   BMI 42.07 kg/m   Physical Exam  Constitutional: She is oriented to person, place, and time. She appears well-developed and well-nourished.  HENT:  Head: Normocephalic and atraumatic.  Eyes: Right eye exhibits no discharge. Left eye exhibits no discharge.  Cardiovascular: Normal rate.   No murmur heard. Pulmonary/Chest: Effort normal.  Musculoskeletal:  Felon to the R pinky  Neurological: She is oriented to person, place, and time.  Skin: Skin is warm and dry. She is not diaphoretic.  Psychiatric: She has a normal mood and affect.  Nursing note and vitals reviewed.    ED Treatments / Results  Labs (all labs ordered are listed, but only abnormal results are displayed) Labs Reviewed - No data  to display  EKG  EKG Interpretation None       Radiology No results found.  Procedures .Marland KitchenIncision and Drainage Date/Time: 12/29/2016 4:45 AM Performed by: Bary Castilla LYN Authorized by: Bary Castilla LYN   Consent:    Consent obtained:  Verbal Location:    Indications for incision and drainage: felon.   Location:  Upper extremity   Upper extremity location:  Finger   Finger location:  R small finger Pre-procedure details:    Skin preparation:  Betadine Anesthesia (see MAR for exact dosages):    Anesthesia method:  Local infiltration   Local anesthetic:  Lidocaine 1% WITH epi Procedure type:    Complexity:  Complex Procedure details:    Incision types:  Single straight   Incision depth:  Submucosal    Scalpel blade:  10   Wound management:  Probed and deloculated   Drainage:  Purulent   Drainage amount:  Moderate   Wound treatment:  Wound left open   Packing materials:  None Post-procedure details:    Patient tolerance of procedure:  Tolerated well, no immediate complications Comments:     Longitudinal paramedian incison placed after digital block.    (including critical care time)    Medications Ordered in ED Medications  lidocaine (PF) (XYLOCAINE) 1 % injection 5 mL (not administered)     Initial Impression / Assessment and Plan / ED Course  I have reviewed the triage vital signs and the nursing notes.  Pertinent labs & imaging results that were available during my care of the patient were reviewed by me and considered in my medical decision making (see chart for details).     31 year old female presenting with swelling and erythema to the right pinky.  Patient reports that it started Monday.  Denies any injury to the area.  Felon drained. Pus present.   We will have her follow-up with him as needed.  We will give antibiotics.  However pus is expressed I think likely the incision will help infection resolve.    Final Clinical Impressions(s) / ED Diagnoses   Final diagnoses:  None    New Prescriptions New Prescriptions   No medications on file     Abelino Derrick, MD 12/29/16 [phone]

## 2017-06-03 ENCOUNTER — Other Ambulatory Visit: Payer: Self-pay

## 2017-06-03 ENCOUNTER — Emergency Department (HOSPITAL_BASED_OUTPATIENT_CLINIC_OR_DEPARTMENT_OTHER)
Admission: EM | Admit: 2017-06-03 | Discharge: 2017-06-03 | Disposition: A | Discharge status: Home / Self Care | Payer: Medicaid Other | Attending: Emergency Medicine | Admitting: Emergency Medicine

## 2017-06-03 ENCOUNTER — Encounter (HOSPITAL_BASED_OUTPATIENT_CLINIC_OR_DEPARTMENT_OTHER): Payer: Self-pay

## 2017-06-03 DIAGNOSIS — Z7984 Long term (current) use of oral hypoglycemic drugs: Secondary | ICD-10-CM | POA: Diagnosis not present

## 2017-06-03 DIAGNOSIS — N189 Chronic kidney disease, unspecified: Secondary | ICD-10-CM | POA: Diagnosis not present

## 2017-06-03 DIAGNOSIS — L309 Dermatitis, unspecified: Secondary | ICD-10-CM | POA: Insufficient documentation

## 2017-06-03 DIAGNOSIS — N764 Abscess of vulva: Secondary | ICD-10-CM | POA: Insufficient documentation

## 2017-06-03 DIAGNOSIS — E1122 Type 2 diabetes mellitus with diabetic chronic kidney disease: Secondary | ICD-10-CM | POA: Diagnosis not present

## 2017-06-03 DIAGNOSIS — L853 Xerosis cutis: Secondary | ICD-10-CM

## 2017-06-03 DIAGNOSIS — Z87891 Personal history of nicotine dependence: Secondary | ICD-10-CM | POA: Diagnosis not present

## 2017-06-03 DIAGNOSIS — L0291 Cutaneous abscess, unspecified: Secondary | ICD-10-CM | POA: Diagnosis present

## 2017-06-03 MED ORDER — CEPHALEXIN 500 MG PO CAPS: 500.0000 mg | ORAL_CAPSULE | Freq: Four times a day (QID) | ORAL | 0 refills | Status: DC

## 2017-06-03 MED ORDER — LIDOCAINE HCL (PF) 1 % IJ SOLN
10.0000 mL | Freq: Once | INTRAMUSCULAR | Status: AC
  Administered 2017-06-03: 10 mL
  Filled 2017-06-03: qty 10

## 2017-06-03 NOTE — ED Provider Notes (Signed)
[facility] EMERGENCY DEPARTMENT Provider Note   CSN: [id number] Arrival date & time: 06/03/17  1936     History   Chief Complaint Chief Complaint  Patient presents with  . Abscess    HPI Victoria Christensen is a 32 y.o. female.  She presents to the emergency department with 2 complaints.  First 1 is a boil on her labia that is been going on for a couple of days.  She has had this before but has never required drainage.  It is not associated with any vaginal bleeding or discharge.  It causes moderate amount of pain worse with ambulation and palpation.  There is been no fever. Second complaint is a rash that is been going on Over her arms and legs and chest for over a week.  She works in a [employer] with a lot of dust and she states other people have this rash 2.  It is very itchy.  She has not seen any bites or insects.  No one at home has similar complaints.  The history is provided by the patient.  Abscess  Location:  Pelvis Pelvic abscess location:  Vulva Abscess quality: painful   Abscess quality: not draining   Duration:  2 days Progression:  Worsening Pain details:    Quality:  Throbbing   Severity:  Moderate   Timing:  Constant   Progression:  Worsening Chronicity:  New Context: not diabetes and not immunosuppression   Relieved by:  Nothing Worsened by:  Nothing Associated symptoms: no fever, no nausea and no vomiting   Risk factors: no hx of MRSA     Past Medical History:  Diagnosis Date  . Anemia   . Chronic kidney disease    pyelonephritis  . Former smoker   . Gestational diabetes    insulin dependent  . PCOS (polycystic ovarian syndrome) 2016    Patient Active Problem List   Diagnosis Date Noted  . Type 2 diabetes mellitus (HCC) 06/22/2015  . Obesity, Class III, BMI 40-49.9 (morbid obesity) (HCC) 06/22/2015    Past Surgical History:  Procedure Laterality Date  . CESAREAN SECTION N/A 05/10/2015   Procedure: CESAREAN SECTION;  Surgeon: Adam Phenix, MD;  Location: [facility];  Service: Obstetrics;  Laterality: N/A;  . NO PAST SURGERIES       OB History    Gravida  1   Para  1   Term  1   Preterm  0   AB  0   Living  1     SAB  0   TAB  0   Ectopic  0   Multiple  0   Live Births  1            Home Medications    Prior to Admission medications   Medication Sig Start Date End Date Taking? Authorizing Provider  cephALEXin (KEFLEX) 500 MG capsule Take 1 capsule (500 mg total) by mouth 4 (four) times daily. 12/29/16   Mackuen, Courteney Lyn, MD  ibuprofen (ADVIL,MOTRIN) 800 MG tablet Take 1 tablet (800 mg total) by mouth 3 (three) times daily. 12/29/16   Mackuen, Courteney Lyn, MD  metFORMIN (GLUCOPHAGE) 500 MG tablet Take 500 mg by mouth 2 (two) times daily with a meal. Reported on 04/17/2015    [provider]  oxyCODONE-acetaminophen (PERCOCET/ROXICET) 5-325 MG tablet Take 1-2 tablets by mouth every 4 (four) hours as needed (Give 1 tab if pain <7/10.  Give 2 tab if pain >7.). 05/13/15  Beaulah Dinning, MD    Family History Family History  Problem Relation Age of Onset  . Diabetes Father   . Kidney disease Father     Social History Social History   Tobacco Use  . Smoking status: Former Games developer  . Smokeless tobacco: Never Used  Substance Use Topics  . Alcohol use: Yes    Comment: occ  . Drug use: No     Allergies   Patient has no known allergies.   Review of Systems Review of Systems  Constitutional: Negative for fever.  HENT: Negative for sore throat.   Respiratory: Negative for shortness of breath.   Cardiovascular: Negative for chest pain.  Gastrointestinal: Negative for abdominal pain, nausea and vomiting.  Genitourinary: Negative for dysuria.  Musculoskeletal: Negative for back pain and neck pain.  Skin: Positive for rash.     Physical Exam Updated Vital Signs BP 132/83 (BP Location: Right Arm)   Pulse 71   Temp 98 F (36.7 C) (Oral)   Resp 18   Ht 5\' 2"   (1.575 m)   Wt 102.5 kg (226 lb)   LMP 05/11/2017   SpO2 99%   BMI 41.34 kg/m   Physical Exam  Constitutional: She appears well-developed and well-nourished.  HENT:  Head: Normocephalic and atraumatic.  Eyes: Conjunctivae are normal.  Neck: Neck supple.  Cardiovascular: Normal rate and regular rhythm.  Pulmonary/Chest: Effort normal and breath sounds normal.  Abdominal: Soft. Bowel sounds are normal.  Genitourinary:     Genitourinary Comments: Chaperone was present during exam. Nurse E  Neurological: She is alert. GCS eye subscore is 4. GCS verbal subscore is 5. GCS motor subscore is 6.  Skin: Skin is warm and dry.  I do not see any specific lesions compatible with bites or scabies.  She does have very dry skin.  There is one area of tenderness that she shows me on her lower leg but it is in an area where she shaved and there is some induration with a little bit of a scab.  Likely an infected follicle.  Psychiatric: She has a normal mood and affect.     ED Treatments / Results  Labs (all labs ordered are listed, but only abnormal results are displayed) Labs Reviewed - No data to display  EKG None  Radiology No results found.  Procedures .Marland KitchenIncision and Drainage Date/Time: 06/03/2017 9:17 PM Performed by: Terrilee Files, MD Authorized by: Terrilee Files, MD   Consent:    Consent obtained:  Verbal   Consent given by:  Patient   Risks discussed:  Bleeding, incomplete drainage, pain and infection   Alternatives discussed:  No treatment, delayed treatment and referral Location:    Type:  Abscess   Size:  2x4 cm   Location:  Anogenital   Anogenital location:  Vulva Pre-procedure details:    Skin preparation:  Betadine Anesthesia (see MAR for exact dosages):    Anesthesia method:  Local infiltration   Local anesthetic:  Lidocaine 1% w/o epi Procedure type:    Complexity:  Simple Procedure details:    Incision types:  Single straight   Scalpel blade:  15    Wound management:  Probed and deloculated   Drainage:  Purulent and bloody   Drainage amount:  Scant   Wound treatment:  Wound left open   Packing materials:  None Post-procedure details:    Patient tolerance of procedure:  Tolerated well, no immediate complications   (including critical care time)  Medications Ordered  in ED Medications  lidocaine (PF) (XYLOCAINE) 1 % injection 10 mL (has no administration in time range)     Initial Impression / Assessment and Plan / ED Course  I have reviewed the triage vital signs and the nursing notes.  Pertinent labs & imaging results that were available during my care of the patient were reviewed by me and considered in my medical decision making (see chart for details).      Final Clinical Impressions(s) / ED Diagnoses   Final diagnoses:  Vulvar abscess  Dry skin dermatitis    ED Discharge Orders        Ordered    cephALEXin (KEFLEX) 500 MG capsule  4 times daily     06/03/17 2119       Terrilee FilesButler, Joven Mom C, MD 06/04/17 1126

## 2017-06-03 NOTE — ED Notes (Signed)
Assisted MD with I & D.  Patient tolerated it well.

## 2017-06-03 NOTE — Discharge Instructions (Addendum)
Your evaluated in the emergency department for an abscess on your left vulva.  We drained that area and will continue to be painful and bleed for a few days.  We are prescribing antibiotics and you should finish those.  Tylenol and ibuprofen for pain.  Sits baths  may also help heal the area.  You also were complaining of a rash over your arms legs and trunk.  You should use lots of moisturizer for your skin.  I do not see any obvious signs of infection at this time.

## 2017-06-03 NOTE — ED Notes (Signed)
ED Provider at bedside. 

## 2017-06-03 NOTE — ED Triage Notes (Signed)
C/o vaginal abscess x 2 days-scattered rash x 1 week

## 2017-06-03 NOTE — ED Notes (Signed)
Patient has a bump to her left labia minora.

## 2017-07-02 ENCOUNTER — Other Ambulatory Visit: Payer: Self-pay

## 2017-07-02 ENCOUNTER — Emergency Department (HOSPITAL_BASED_OUTPATIENT_CLINIC_OR_DEPARTMENT_OTHER)
Admission: EM | Admit: 2017-07-02 | Discharge: 2017-07-02 | Disposition: A | Discharge status: Home / Self Care | Payer: Medicaid Other | Attending: Emergency Medicine | Admitting: Emergency Medicine

## 2017-07-02 ENCOUNTER — Encounter (HOSPITAL_BASED_OUTPATIENT_CLINIC_OR_DEPARTMENT_OTHER): Payer: Self-pay

## 2017-07-02 DIAGNOSIS — L03115 Cellulitis of right lower limb: Secondary | ICD-10-CM | POA: Insufficient documentation

## 2017-07-02 DIAGNOSIS — Y929 Unspecified place or not applicable: Secondary | ICD-10-CM | POA: Insufficient documentation

## 2017-07-02 DIAGNOSIS — Z7984 Long term (current) use of oral hypoglycemic drugs: Secondary | ICD-10-CM | POA: Diagnosis not present

## 2017-07-02 DIAGNOSIS — N189 Chronic kidney disease, unspecified: Secondary | ICD-10-CM | POA: Insufficient documentation

## 2017-07-02 DIAGNOSIS — E119 Type 2 diabetes mellitus without complications: Secondary | ICD-10-CM | POA: Insufficient documentation

## 2017-07-02 DIAGNOSIS — Z87891 Personal history of nicotine dependence: Secondary | ICD-10-CM | POA: Diagnosis not present

## 2017-07-02 DIAGNOSIS — W57XXXA Bitten or stung by nonvenomous insect and other nonvenomous arthropods, initial encounter: Secondary | ICD-10-CM | POA: Insufficient documentation

## 2017-07-02 DIAGNOSIS — Y9389 Activity, other specified: Secondary | ICD-10-CM | POA: Diagnosis not present

## 2017-07-02 DIAGNOSIS — Y998 Other external cause status: Secondary | ICD-10-CM | POA: Diagnosis not present

## 2017-07-02 DIAGNOSIS — S70361A Insect bite (nonvenomous), right thigh, initial encounter: Secondary | ICD-10-CM | POA: Diagnosis present

## 2017-07-02 MED ORDER — CEPHALEXIN 500 MG PO CAPS: 500.0000 mg | ORAL_CAPSULE | Freq: Four times a day (QID) | ORAL | 0 refills | Status: DC

## 2017-07-02 MED ORDER — SULFAMETHOXAZOLE-TRIMETHOPRIM 800-160 MG PO TABS: 1.0000 | ORAL_TABLET | Freq: Two times a day (BID) | ORAL | 0 refills | Status: DC

## 2017-07-02 NOTE — ED Triage Notes (Signed)
Pt c/o ?spider bite to right thigh x 2 days-NAD-steady gait

## 2017-07-02 NOTE — Discharge Instructions (Signed)
Please take all of your antibiotics until finished!   You may develop abdominal discomfort or diarrhea from the antibiotic.  You may help offset this with probiotics which you can buy or get in yogurt. Do not eat  or take the probiotics until 2 hours after your antibiotic.  ° °Follow up with your PCP this week for recheck. If you develop worsening or new concerning symptoms you can return to the emergency department for re-evaluation. See below for additional return precautions.  ° °Cellulitis is a skin infection. The infected area is usually red and sore. This condition occurs most often in the arms and lower legs. It is very important to get treated for this condition. °Follow these instructions at home: °Take over-the-counter and prescription medicines only as told by your doctor. °If you were prescribed an antibiotic medicine, take it as told by your doctor. Do not stop taking the antibiotic even if you start to feel better. °Drink enough fluid to keep your pee (urine) clear or pale yellow. °Do not touch or rub the infected area. °Raise (elevate) the infected area above the level of your heart while you are sitting or lying down. °Place warm or cold wet cloths (warm or cold compresses) on the infected area. Do this as told by your doctor. °Keep all follow-up visits as told by your doctor. This is important. These visits let your doctor make sure your infection is not getting worse. °Contact a doctor if: °You have a fever. °Your symptoms do not get better after 1-2 days of treatment. °Your bone or joint under the infected area starts to hurt after the skin has healed. °Your infection comes back. This can happen in the same area or another area. °You have a swollen bump in the infected area. °You have new symptoms. °You feel ill and also have muscle aches and pains. °Get help right away if: °Your symptoms get worse. °You feel very sleepy. °You throw up (vomit) or have watery poop (diarrhea) for a long time. °There  are red streaks coming from the infected area. °Your red area gets larger. °Your red area turns darker. ° °

## 2017-07-02 NOTE — ED Provider Notes (Signed)
[facility] EMERGENCY DEPARTMENT Provider Note   CSN: [id number] Arrival date & time: 07/02/17  1641     History   Chief Complaint Chief Complaint  Patient presents with  . Insect Bite    HPI Victoria Christensen is a 32 y.o. female with a past medical history of type 2 diabetes who presents the emergency department today for insect bite.  Patient states that she was outside working when she noticed that she was bitten by a small insect.  She notes that she now has an area of surrounding firmness and redness.  She notes that the area is painful to the touch.  She denies any drainage from the area.  She has not taken anything for symptoms.  She denies any fever, chills, nausea or vomiting.  HPI  Past Medical History:  Diagnosis Date  . Anemia   . Chronic kidney disease    pyelonephritis  . Former smoker   . Gestational diabetes    insulin dependent  . PCOS (polycystic ovarian syndrome) 2016    Patient Active Problem List   Diagnosis Date Noted  . Type 2 diabetes mellitus (HCC) 06/22/2015  . Obesity, Class III, BMI 40-49.9 (morbid obesity) (HCC) 06/22/2015    Past Surgical History:  Procedure Laterality Date  . CESAREAN SECTION N/A 05/10/2015   Procedure: CESAREAN SECTION;  Surgeon: Adam Phenix, MD;  Location: [facility];  Service: Obstetrics;  Laterality: N/A;  . NO PAST SURGERIES       OB History    Gravida  1   Para  1   Term  1   Preterm  0   AB  0   Living  1     SAB  0   TAB  0   Ectopic  0   Multiple  0   Live Births  1            Home Medications    Prior to Admission medications   Medication Sig Start Date End Date Taking? Authorizing Provider  cephALEXin (KEFLEX) 500 MG capsule Take 1 capsule (500 mg total) by mouth 4 (four) times daily. 06/03/17   Terrilee Files, MD  ibuprofen (ADVIL,MOTRIN) 800 MG tablet Take 1 tablet (800 mg total) by mouth 3 (three) times daily. 12/29/16   Mackuen, Courteney Lyn, MD  metFORMIN  (GLUCOPHAGE) 500 MG tablet Take 500 mg by mouth 2 (two) times daily with a meal. Reported on 04/17/2015    [provider]  oxyCODONE-acetaminophen (PERCOCET/ROXICET) 5-325 MG tablet Take 1-2 tablets by mouth every 4 (four) hours as needed (Give 1 tab if pain <7/10.  Give 2 tab if pain >7.). 05/13/15   Beaulah Dinning, MD    Family History Family History  Problem Relation Age of Onset  . Diabetes Father   . Kidney disease Father     Social History Social History   Tobacco Use  . Smoking status: Former Games developer  . Smokeless tobacco: Never Used  Substance Use Topics  . Alcohol use: Yes    Comment: occ  . Drug use: No     Allergies   Patient has no known allergies.   Review of Systems Review of Systems  All other systems reviewed and are negative.    Physical Exam Updated Vital Signs BP 129/82 (BP Location: Right Arm)   Pulse 78   Temp 98.2 F (36.8 C) (Oral)   Resp 16   Ht  (1.575 m)   Wt 102.6  kg (226 lb 3.1 oz)   LMP 06/16/2017   SpO2 99%   BMI 41.37 kg/m   Physical Exam  Constitutional: She appears well-developed and well-nourished.  HENT:  Head: Normocephalic and atraumatic.  Right Ear: External ear normal.  Left Ear: External ear normal.  Eyes: Conjunctivae are normal. Right eye exhibits no discharge. Left eye exhibits no discharge. No scleral icterus.  Cardiovascular:  Pulses:      Dorsalis pedis pulses are 2+ on the right side.       Posterior tibial pulses are 2+ on the right side.  No lower extremity edema.   Pulmonary/Chest: Effort normal. No respiratory distress.  Musculoskeletal:       Right knee: She exhibits normal range of motion, no swelling and no erythema.       Right ankle: She exhibits normal range of motion and no swelling.  Neurovascularly intact distally. Compartments soft above and below affected area.   Neurological: She is alert. No sensory deficit.  Skin: Skin is warm and dry. Capillary refill takes less than 2  seconds. There is erythema. No pallor.  On patient's right upper, lateral thigh there is a 4.5 cm x 6.5 cm area of erythema and heat.  Centralized there is a 3 similar by 4 cm area of induration.  There is a small area that appears to be the bite wound that is centralized.  There is no palpable area of fluctuance.  No discharge.  Psychiatric: She has a normal mood and affect.  Nursing note and vitals reviewed.    ED Treatments / Results  Labs (all labs ordered are listed, but only abnormal results are displayed) Labs Reviewed - No data to display  EKG None  Radiology No results found.  Procedures Procedures (including critical care time)  Medications Ordered in ED Medications - No data to display   Initial Impression / Assessment and Plan / ED Course  I have reviewed the triage vital signs and the nursing notes.  Pertinent labs & imaging results that were available during my care of the patient were reviewed by me and considered in my medical decision making (see chart for details).     32 year old female that presents the emergency department today for cellulitis.  There is no palpable fluctuance or gross abscess which would be amenable to I&D today.  The area was marked and the patient was encouraged to return if redness spreads beyond the area marked, begin streaking or she develops any fever or nausea/vomiting.  Patient's vital signs are reassuring.  She is afebrile, non-tachycardic, nonseptic and nontoxic appearing.  Feel the patient is stable for a trial of outpatient oral antibiotics.  Will discharge the patient on oral antibiotics with strict follow-up instructions.  Patient does have a PCP that she states she can follow-up with this week.  Will give return precautions to the emergency department otherwise.  Patient verbalizes reasons to return to the emergency department.  She appears safe for discharge.  Final Clinical Impressions(s) / ED Diagnoses   Final diagnoses:    Cellulitis of right lower extremity    ED Discharge Orders        Ordered    cephALEXin (KEFLEX) 500 MG capsule  4 times daily     07/02/17 1855    sulfamethoxazole-trimethoprim (BACTRIM DS,SEPTRA DS) 800-160 MG tablet  2 times daily     07/02/17 1855       Princella Pellegrini 07/02/17 1857    Pricilla Loveless, MD 07/02/17  1946  

## 2018-01-21 IMAGING — US US MFM FETAL BPP W/O NON-STRESS
1 series · 14 of 28 positions shown · non-contrast
Comparison: none

[Series 1: us mfm fetal bpp w/o non-stress · 42 acquisitions, 14 frames shown]
[im 2/42]
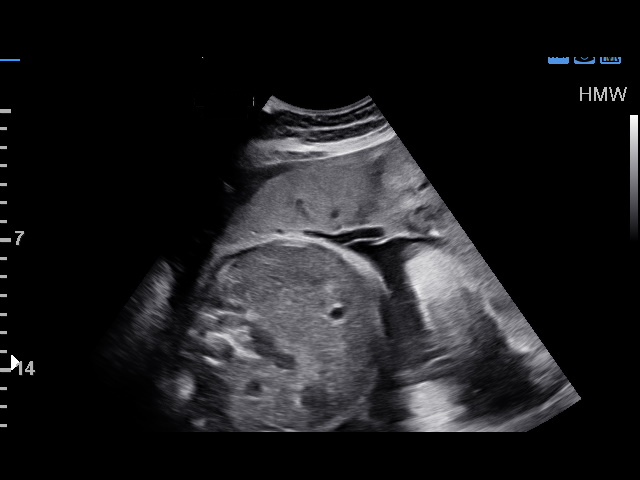
[im 5/42]
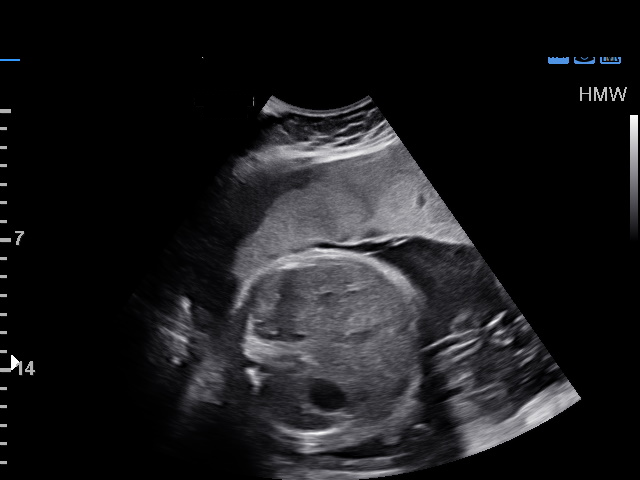
[im 8/42]
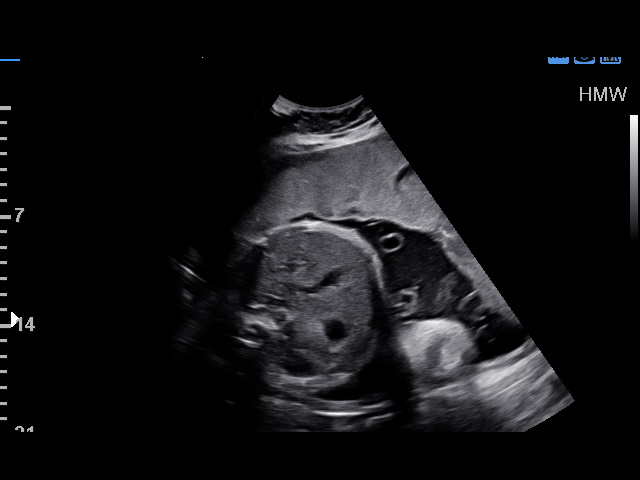
[im 11/42]
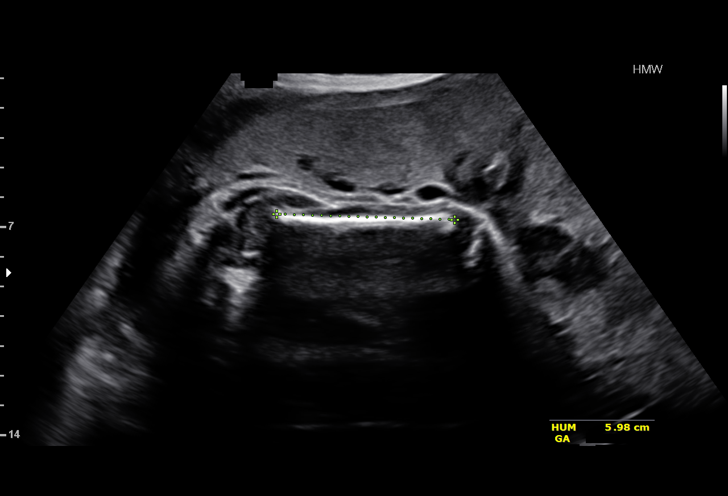
[im 14/42]
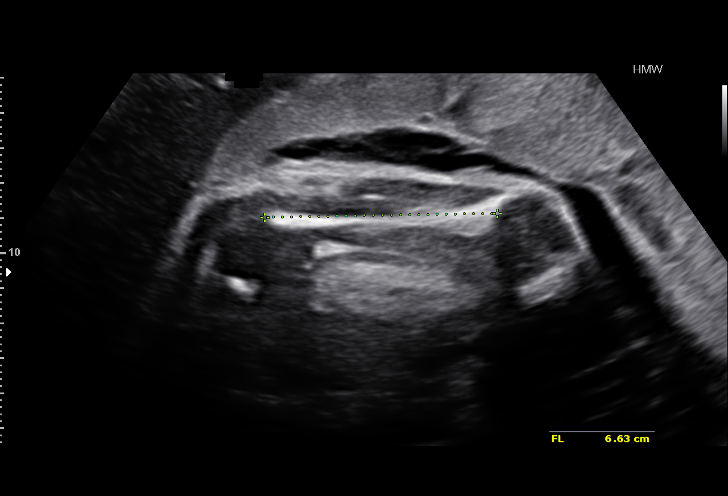
[im 17/42]
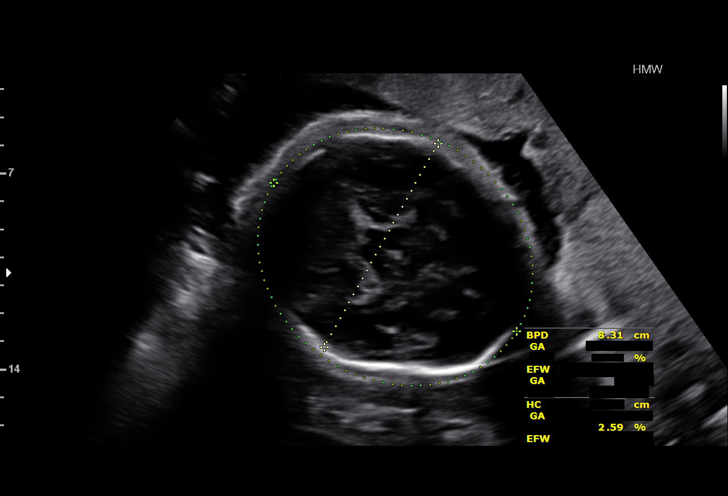
[im 20/42]
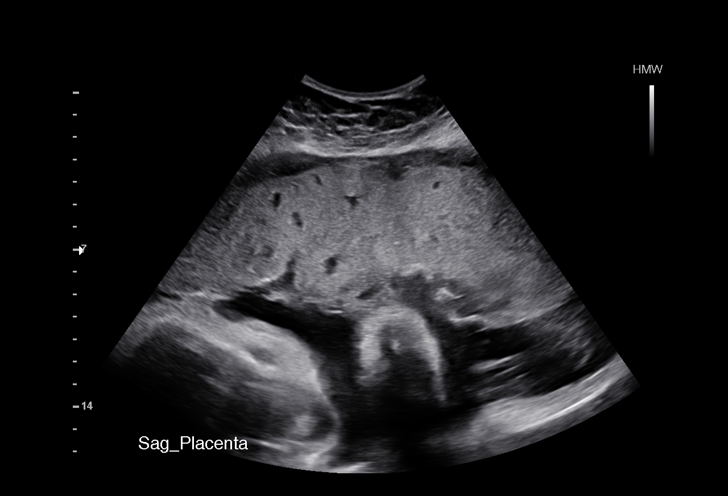
[im 23/42]
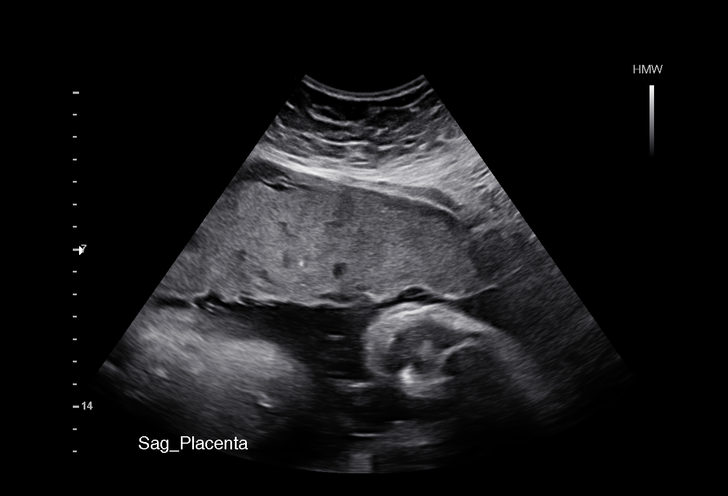
[im 26/42]
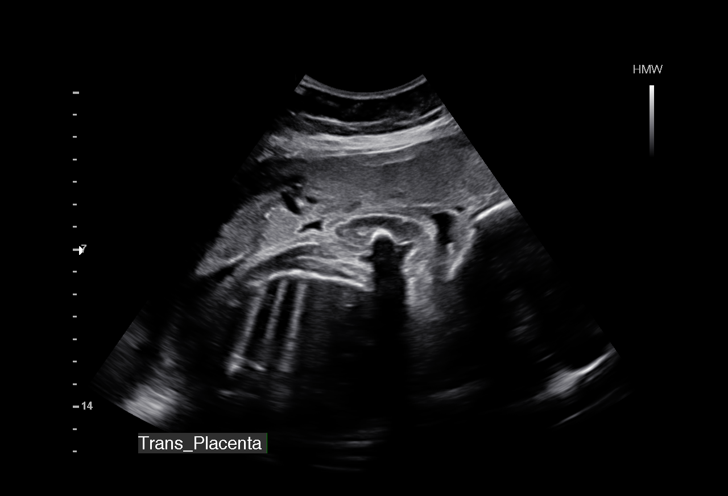
[im 29/42]
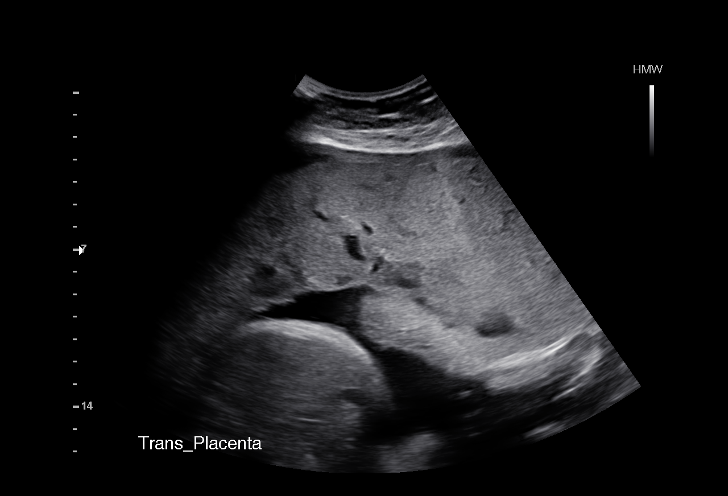
[im 32/42]
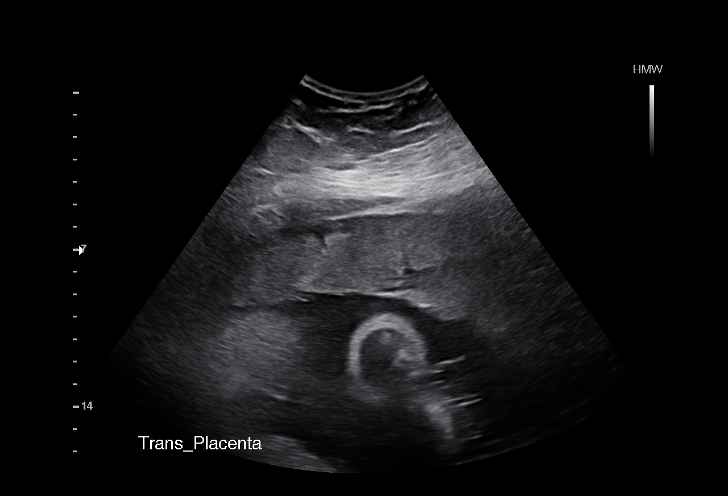
[im 35/42]
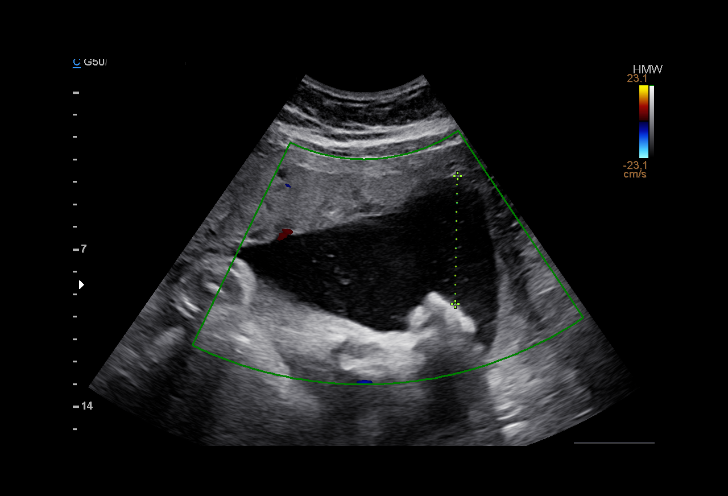
[im 38/42]
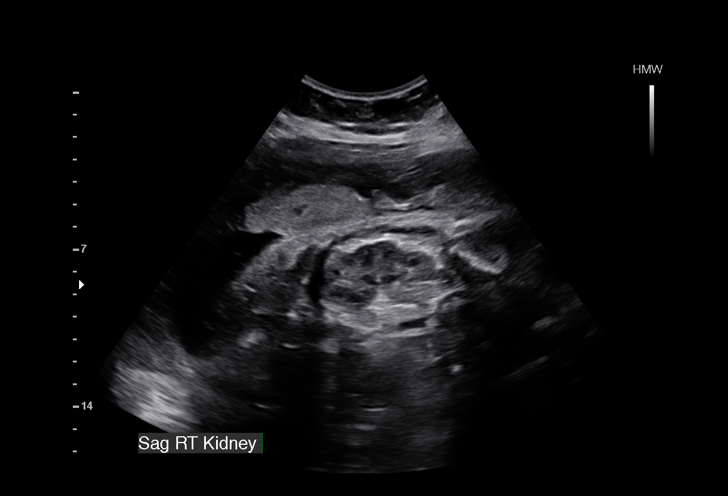
[im 42/42]
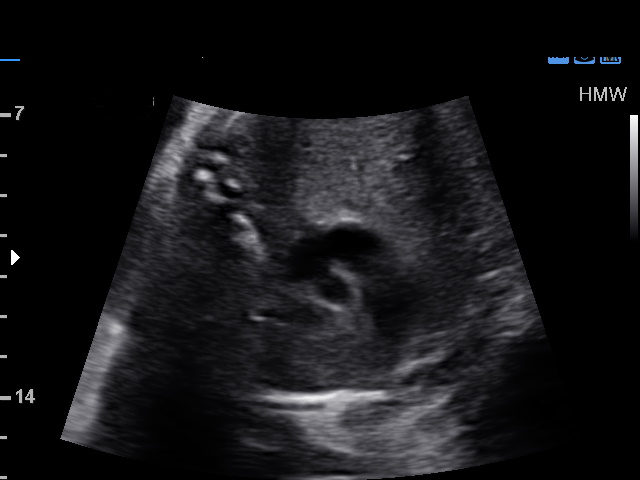

[14 of 28 positions shown; findings below may reference images not displayed]

pm)

Name:       FLIESENLEGER LAP                     Visit  04/21/2015 [DATE]
Date:

Performed By:      Breiner David Mier         Secondary          [REDACTED] Clinic-
Faculty Physician
[facility]
Clinic

2  REUS           [id number]       [id number]     [id number]
Indications

34 weeks gestation of pregnancy
Non-reactive NST
Abnormal biochemical screen (quad) for
Trisomy 21 ([DATE]); declined further testing
Obesity complicating pregnancy, third
trimester
High risk pregnancy with high inhibin           O23.9,
Gestational diabetes in pregnancy,
controlled by oral hypoglycemic drugs
OB History

Height:        5'2"   Weight:   249        BMI:
Gravidity:     1         Term:  0        Prem:    0        SAB:   0
TOP:           0       Ectopic  0        Living:  0
:
Fetal Evaluation

Num Of Fetuses:      1
Fetal Heart          141
Rate(bpm):
Cardiac Activity:    Observed
Presentation:        Breech
Placenta:            Anterior, above cervical os
P. Cord Insertion:   Previously Visualized

Amniotic Fluid
AFI FV:      Subjectively within normal limits
AFI Sum:     19.42    cm      72  %Tile     Larg Pckt:      5.7  cm
RUQ:   5.4     cm    RLQ:   5.5     cm   LUQ:    5.7     cm   LLQ:    2.82   cm
Biophysical Evaluation

Amniotic F.V:   Within normal limits        F. Tone:        Observed
F. Movement:    Observed                    Score:          [DATE]
F. Breathing:   Observed
Biometry

BPD:      83.5  mm     G. Age:   33w 4d                  CI:         77.2   %    70 - 86
FL/HC:      22.1   %    19.4 -
HC:      300.9  mm     G. Age:   33w 3d          6  %    HC/AC:      0.85        0.96 -
AC:      353.3  mm     G. Age:   39w 2d      > 97   %    FL/BPD      79.8   %    71 - 87
:
FL:       66.6  mm     G. Age:   34w 2d        41   %    FL/AC:      18.9   %    20 - 24
HUM:      58.5  mm     G. Age:   33w 6d        52   %

Est.        9371   gm   6 lb 11 oz    > 90   %
FW:
Gestational Age

LMP:           34w 2d        Date:  08/24/14                  EDD:   05/31/15
U/S Today:     35w 1d                                         EDD:   05/25/15
Best:          34w 2d    Det. By:   LMP  (08/24/14)           EDD:   05/31/15
Anatomy

Cranium:          Appears normal         Aortic Arch:       Previously seen
Fetal Cavum:      Previously seen        Ductal Arch:       Not well visualized
Ventricles:       Appears normal         Diaphragm:         Previously seen
Choroid Plexus:   Previously seen        Stomach:           Appears normal,
left sided
Cerebellum:       Previously seen        Abdomen:           Previously seen
Posterior         Previously seen        Abdominal          Previously seen
Fossa:                                   Wall:
Nuchal Fold:      Not applicable (>20    Cord Vessels:      Previously seen
wks GA)
Face:             Orbits and profile     Kidneys:           Appear normal
previously seen
Lips:             Previously seen        Bladder:           Appears normal
Fetal Thoracic:   Appears normal         Spine:             Previously seen
Heart:            Previously seen        Upper              Previously seen
Extremities:
RVOT:             Previously seen        Lower              Previously seen
Extremities:
LVOT:             Not well visualized

Other:   Technically difficult due to maternal habitus and fetal position.
Cervix Uterus Adnexa

Cervix
Not visualized (advanced GA >21wks)

Uterus
No abnormality visualized.

Left Ovary
Not visualized.

Right Ovary
Not visualized.

Cul De        No free fluid seen.
Sac:

Adnexa:       No abnormality visualized.
Impression

SIUP at 34+2 weeks
Breech presentation
Normal interval anatomy; anatomic survey complete except
for LVOT and DA
Normal amniotic fluid volume
EFW > 90th %tile; AC > 97th %tile; 9371 grams; 6+11;
fetus at risk to be LGA/macrosomic
BPP [DATE]

---------------------------------------------------------------------- Recommendations

Continue twice weekly NSTs with weekly AFIs
Follow-up ultrasound for growth in 4 weeks if EFW is
desired prior to delivery

## 2019-07-16 ENCOUNTER — Emergency Department (HOSPITAL_BASED_OUTPATIENT_CLINIC_OR_DEPARTMENT_OTHER)
Admission: EM | Admit: 2019-07-16 | Discharge: 2019-07-16 | Disposition: A | Discharge status: Home / Self Care | Payer: Medicaid Other | Attending: Emergency Medicine | Admitting: Emergency Medicine

## 2019-07-16 ENCOUNTER — Other Ambulatory Visit: Payer: Self-pay

## 2019-07-16 ENCOUNTER — Encounter (HOSPITAL_BASED_OUTPATIENT_CLINIC_OR_DEPARTMENT_OTHER): Payer: Self-pay | Admitting: Emergency Medicine

## 2019-07-16 DIAGNOSIS — E119 Type 2 diabetes mellitus without complications: Secondary | ICD-10-CM | POA: Diagnosis not present

## 2019-07-16 DIAGNOSIS — H1011 Acute atopic conjunctivitis, right eye: Secondary | ICD-10-CM | POA: Insufficient documentation

## 2019-07-16 DIAGNOSIS — Z7984 Long term (current) use of oral hypoglycemic drugs: Secondary | ICD-10-CM | POA: Insufficient documentation

## 2019-07-16 DIAGNOSIS — Z87891 Personal history of nicotine dependence: Secondary | ICD-10-CM | POA: Insufficient documentation

## 2019-07-16 DIAGNOSIS — H5789 Other specified disorders of eye and adnexa: Secondary | ICD-10-CM | POA: Diagnosis present

## 2019-07-16 MED ORDER — FLUORESCEIN SODIUM 1 MG OP STRP
1.0000 | ORAL_STRIP | Freq: Once | OPHTHALMIC | Status: AC
  Administered 2019-07-16: 1 via OPHTHALMIC

## 2019-07-16 MED ORDER — TETRACAINE HCL 0.5 % OP SOLN
2.0000 [drp] | Freq: Once | OPHTHALMIC | Status: AC
  Administered 2019-07-16: 2 [drp] via OPHTHALMIC
  Filled 2019-07-16: qty 4

## 2019-07-16 MED ORDER — OLOPATADINE HCL 0.2 % OP SOLN: OPHTHALMIC | 0 refills | Status: AC

## 2019-07-16 NOTE — ED Provider Notes (Signed)
[facility] EMERGENCY DEPARTMENT Provider Note   CSN: [id number] Arrival date & time: 07/16/19  1136     History Chief Complaint  Patient presents with  . Eye Problem    Victoria Christensen is a 34 y.o. female with past medical history significant for PCOS who presents for evaluation of right eye irritation.  Patient states since yesterday morning she has noticed copious clear, watery drainage to her right eye.  Also noted a sensation that she feels like there is something in her eye.  Describes a scratchy sensation.  Mild photophobia.  No headache.  States she feels like the skin surrounding her eye is swollen without erythema.  No tenderness.  No recent injury or trauma.  Does not wear glasses or contacts.  Denies additional aggravating or relieving factors.  History obtained from patient and past medical record.  No interpreter is used.  HPI     Past Medical History:  Diagnosis Date  . Anemia   . Chronic kidney disease    pyelonephritis  . Former smoker   . Gestational diabetes    insulin dependent  . PCOS (polycystic ovarian syndrome) 2016    Patient Active Problem List   Diagnosis Date Noted  . Type 2 diabetes mellitus (HCC) 06/22/2015  . Obesity, Class III, BMI 40-49.9 (morbid obesity) (HCC) 06/22/2015    Past Surgical History:  Procedure Laterality Date  . CESAREAN SECTION N/A 05/10/2015   Procedure: CESAREAN SECTION;  Surgeon: Adam Phenix, MD;  Location: [facility];  Service: Obstetrics;  Laterality: N/A;  . NO PAST SURGERIES       OB History    Gravida  1   Para  1   Term  1   Preterm  0   AB  0   Living  1     SAB  0   TAB  0   Ectopic  0   Multiple  0   Live Births  1           Family History  Problem Relation Age of Onset  . Diabetes Father   . Kidney disease Father     Social History   Tobacco Use  . Smoking status: Former Games developer  . Smokeless tobacco: Never Used  Substance Use Topics  . Alcohol use: Yes   Comment: occ  . Drug use: No    Home Medications Prior to Admission medications   Medication Sig Start Date End Date Taking? Authorizing Provider  cephALEXin (KEFLEX) 500 MG capsule Take 1 capsule (500 mg total) by mouth 4 (four) times daily. 07/02/17   Maczis, Elmer Sow, PA-C  ibuprofen (ADVIL,MOTRIN) 800 MG tablet Take 1 tablet (800 mg total) by mouth 3 (three) times daily. 12/29/16   Mackuen, Courteney Lyn, MD  metFORMIN (GLUCOPHAGE) 500 MG tablet Take 500 mg by mouth 2 (two) times daily with a meal. Reported on 04/17/2015    [provider]  Olopatadine HCl 0.2 % SOLN Use 2 drops to right eye as needed 07/16/19   Roi Jafari A, PA-C  oxyCODONE-acetaminophen (PERCOCET/ROXICET) 5-325 MG tablet Take 1-2 tablets by mouth every 4 (four) hours as needed (Give 1 tab if pain <7/10.  Give 2 tab if pain >7.). 05/13/15   Beaulah Dinning, MD  sulfamethoxazole-trimethoprim (BACTRIM DS,SEPTRA DS) 800-160 MG tablet Take 1 tablet by mouth 2 (two) times daily. 07/02/17   Maczis, Elmer Sow, PA-C    Allergies    Patient has no known allergies.  Review  of Systems   Review of Systems  Constitutional: Negative.   HENT: Negative.   Eyes: Positive for photophobia, pain, discharge, redness and itching. Negative for visual disturbance.  Respiratory: Negative.   Cardiovascular: Negative.   Gastrointestinal: Negative.   Genitourinary: Negative.   Musculoskeletal: Negative.   Skin: Negative.   Neurological: Negative.   All other systems reviewed and are negative.  Physical Exam Updated Vital Signs BP 130/76 (BP Location: Right Arm)   Pulse 81   Temp 98.1 F (36.7 C) (Oral)   Resp 18   Ht 5\' 3"  (1.6 m)   Wt 102.1 kg   SpO2 99%   BMI 39.86 kg/m   Physical Exam Vitals and nursing note reviewed.  Constitutional:      General: She is not in acute distress.    Appearance: She is well-developed. She is not ill-appearing, toxic-appearing or diaphoretic.  HENT:     Head: Normocephalic  and atraumatic.     Comments: No periorbital edema, erythema, warmth or exudate.  No tenderness to temporal region.    Nose: Nose normal.     Mouth/Throat:     Mouth: Mucous membranes are dry.  Eyes:     General: Lids are everted, no foreign bodies appreciated. Vision grossly intact. No allergic shiner, visual field deficit or scleral icterus.       Right eye: Discharge (Clear. watery) present.        Left eye: Discharge (Clear, watery) present.    Intraocular pressure: Right eye pressure is 14 mmHg. Left eye pressure is 15 mmHg.     Extraocular Movements: Extraocular movements intact.     Right eye: Normal extraocular motion and no nystagmus.     Left eye: Normal extraocular motion and no nystagmus.     Conjunctiva/sclera:     Right eye: Right conjunctiva is injected. No chemosis, exudate or hemorrhage.    Left eye: Left conjunctiva is injected. No chemosis, exudate or hemorrhage.    Pupils: Pupils are equal, round, and reactive to light.     Slit lamp exam:    Right eye: Anterior chamber quiet.     Left eye: Anterior chamber quiet.     Visual Fields: Right eye visual fields normal and left eye visual fields normal.     Comments: Minimal erythema to sclera. Watery dc to bilateral eyes however worse to right eye.   Cardiovascular:     Rate and Rhythm: Normal rate.     Pulses: Normal pulses.     Heart sounds: Normal heart sounds.  Pulmonary:     Effort: Pulmonary effort is normal. No respiratory distress.     Breath sounds: Normal breath sounds.  Abdominal:     General: Bowel sounds are normal. There is no distension.  Musculoskeletal:        General: Normal range of motion.     Cervical back: Normal range of motion.  Skin:    General: Skin is warm and dry.     Capillary Refill: Capillary refill takes less than 2 seconds.     Comments: No edema, erythema or warmth.  Neurological:     General: No focal deficit present.     Mental Status: She is alert.     Cranial Nerves:  Cranial nerves are intact.     Sensory: Sensation is intact.     Motor: Motor function is intact.     Coordination: Coordination is intact.     Gait: Gait is intact.     Comments:  Cranial nerves II through XII grossly intact.  Negative finger-nose, heel-to-shin, Romberg     ED Results / Procedures / Treatments   Labs (all labs ordered are listed, but only abnormal results are displayed) Labs Reviewed - No data to display  EKG None  Radiology No results found.  Procedures Procedures (including critical care time)  Medications Ordered in ED Medications  fluorescein ophthalmic strip 1 strip (1 strip Both Eyes Given 07/16/19 1207)  tetracaine (PONTOCAINE) 0.5 % ophthalmic solution 2 drop (2 drops Both Eyes Given 07/16/19 1207)    ED Course  I have reviewed the triage vital signs and the nursing notes.  Pertinent labs & imaging results that were available during my care of the patient were reviewed by me and considered in my medical decision making (see chart for details).  34 year old female appears otherwise well presents for evaluation of right eye irritation.  Symptoms began yesterday morning.  Does not wear contacts or glasses.  Associated headache.  Nonfocal neuro exam without deficits.  Patient with copious clear, watery drainage to bilateral eyes are worse to right.  Subjective eyelid swelling however no appreciable edema, erythema or warmth on exam.  EOMs intact without pain.  She does have mildly injected right sclera. IOP 14 right 15 left.  No photophobia on exam.  PERRLA.  No fluorescein uptake, negative Seidel sign.   Patient presentation consistent with viral conjunctivitis.  No purulent discharge, corneal abrasions, entrapment, consensual photophobia, or dendritic staining with fluorescein study.  Presentation non-concerning for iritis, uveitis, bacterial conjunctivitis, corneal abrasions, acute angle glaucoma or HSV.  NO orbital or periorbital cellulitis. No antibiotics  are indicated and patient will be prescribed naphazoline for itching.  Personal hygiene and frequent handwashing discussed.  Patient advised to followup with ophthalmologist if symptoms persist or worsen in any way including vision change or purulent discharge.  Patient verbalizes understanding and is agreeable with discharge.  The patient has been appropriately medically screened and/or stabilized in the ED. I have low suspicion for any other emergent medical condition which would require further screening, evaluation or treatment in the ED or require inpatient management.  Patient is hemodynamically stable and in no acute distress.  Patient able to ambulate in department prior to ED.  Evaluation does not show acute pathology that would require ongoing or additional emergent interventions while in the emergency department or further inpatient treatment.  I have discussed the diagnosis with the patient and answered all questions.  Pain is been managed while in the emergency department and patient has no further complaints prior to discharge.  Patient is comfortable with plan discussed in room and is stable for discharge at this time.  I have discussed strict return precautions for returning to the emergency department.  Patient was encouraged to follow-up with PCP/specialist refer to at discharge.    MDM Rules/Calculators/A&P                       Final Clinical Impression(s) / ED Diagnoses Final diagnoses:  Allergic conjunctivitis of right eye    Rx / DC Orders ED Discharge Orders         Ordered    Olopatadine HCl 0.2 % SOLN     07/16/19 1251           Trini Christiansen A, PA-C 07/16/19 1253    Linwood Dibbles, MD 07/19/19 [phone]

## 2019-07-16 NOTE — Discharge Instructions (Addendum)
Use drops as prescribed.  Place cold compress to eye.  Call the Opthalmologist today to schedule an appointment. His contact information listed on discharge paper work.  May return for new or worsening symptoms

## 2019-07-16 NOTE — ED Triage Notes (Signed)
Reports right eye pain and swelling since yesterday.

## 2020-03-01 ENCOUNTER — Other Ambulatory Visit: Payer: Self-pay

## 2020-03-01 ENCOUNTER — Encounter (HOSPITAL_BASED_OUTPATIENT_CLINIC_OR_DEPARTMENT_OTHER): Payer: Self-pay

## 2020-03-01 ENCOUNTER — Emergency Department (HOSPITAL_BASED_OUTPATIENT_CLINIC_OR_DEPARTMENT_OTHER)
Admission: EM | Admit: 2020-03-01 | Discharge: 2020-03-01 | Disposition: A | Discharge status: Home / Self Care | Payer: Medicaid Other | Attending: Emergency Medicine | Admitting: Emergency Medicine

## 2020-03-01 DIAGNOSIS — Z87891 Personal history of nicotine dependence: Secondary | ICD-10-CM | POA: Diagnosis not present

## 2020-03-01 DIAGNOSIS — T1501XA Foreign body in cornea, right eye, initial encounter: Secondary | ICD-10-CM | POA: Diagnosis present

## 2020-03-01 DIAGNOSIS — H20011 Primary iridocyclitis, right eye: Secondary | ICD-10-CM | POA: Diagnosis not present

## 2020-03-01 DIAGNOSIS — H209 Unspecified iridocyclitis: Secondary | ICD-10-CM

## 2020-03-01 DIAGNOSIS — N189 Chronic kidney disease, unspecified: Secondary | ICD-10-CM | POA: Diagnosis not present

## 2020-03-01 DIAGNOSIS — W458XXA Other foreign body or object entering through skin, initial encounter: Secondary | ICD-10-CM | POA: Insufficient documentation

## 2020-03-01 DIAGNOSIS — Z7984 Long term (current) use of oral hypoglycemic drugs: Secondary | ICD-10-CM | POA: Insufficient documentation

## 2020-03-01 DIAGNOSIS — E1122 Type 2 diabetes mellitus with diabetic chronic kidney disease: Secondary | ICD-10-CM | POA: Diagnosis not present

## 2020-03-01 MED ORDER — FLUORESCEIN SODIUM 1 MG OP STRP
2.0000 | ORAL_STRIP | Freq: Once | OPHTHALMIC | Status: AC
  Administered 2020-03-01: 2 via OPHTHALMIC
  Filled 2020-03-01: qty 2

## 2020-03-01 MED ORDER — TETRACAINE HCL 0.5 % OP SOLN
2.0000 [drp] | Freq: Once | OPHTHALMIC | Status: AC
  Administered 2020-03-01: 2 [drp] via OPHTHALMIC
  Filled 2020-03-01: qty 4

## 2020-03-01 NOTE — ED Triage Notes (Signed)
Pt c/o redness.swelling to right eye-reports she may have gotten fb in right eye 2 days ago at [employer] feels sx may be r/t to "allergy to dust"-NAD-steady gait

## 2020-03-01 NOTE — ED Provider Notes (Signed)
[facility] EMERGENCY DEPARTMENT Provider Note   CSN: [id number] Arrival date & time: 03/01/20  1329     History Chief Complaint  Patient presents with  . Eye Problem   HPI  Blood pressure 123/80, pulse 97, temperature 97.9 F (36.6 C), temperature source Oral, resp. rate 18, height 5\' 2"  (1.575 m), weight 103.9 kg, last menstrual period 02/09/2020, SpO2 99 %, unknown if currently breastfeeding.  Victoria Christensen is a 34 y.o. female complaining of foreign body sensation in right eye onset 2 days ago with swelling and redness.  States that when she wakes up in the morning the eye is crusted shut, there is a light sensitivity as well.  She is not a contact lens wearer, vision is slightly blurred.      Past Medical History:  Diagnosis Date  . Anemia   . Chronic kidney disease    pyelonephritis  . Former smoker   . Gestational diabetes    insulin dependent  . PCOS (polycystic ovarian syndrome) 2016    Patient Active Problem List   Diagnosis Date Noted  . Type 2 diabetes mellitus (HCC) 06/22/2015  . Obesity, Class III, BMI 40-49.9 (morbid obesity) (HCC) 06/22/2015    Past Surgical History:  Procedure Laterality Date  . CESAREAN SECTION N/A 05/10/2015   Procedure: CESAREAN SECTION;  Surgeon: 07/10/2015, MD;  Location: [facility];  Service: Obstetrics;  Laterality: N/A;  . NO PAST SURGERIES       OB History    Gravida  1   Para  1   Term  1   Preterm  0   AB  0   Living  1     SAB  0   IAB  0   Ectopic  0   Multiple  0   Live Births  1           Family History  Problem Relation Age of Onset  . Diabetes Father   . Kidney disease Father     Social History   Tobacco Use  . Smoking status: Former [employer] [place]  . Smokeless tobacco: Never Used  Substance Use Topics  . Alcohol use: Yes    Comment: occ  . Drug use: No    Home Medications Prior to Admission medications   Medication Sig Start Date End Date Taking? Authorizing Provider   cephALEXin (KEFLEX) 500 MG capsule Take 1 capsule (500 mg total) by mouth 4 (four) times daily. 07/02/17   Maczis, 09/01/17, PA-C  ibuprofen (ADVIL,MOTRIN) 800 MG tablet Take 1 tablet (800 mg total) by mouth 3 (three) times daily. 12/29/16   Mackuen, Courteney Lyn, MD  metFORMIN (GLUCOPHAGE) 500 MG tablet Take 500 mg by mouth 2 (two) times daily with a meal. Reported on 04/17/2015    [provider]  Olopatadine HCl 0.2 % SOLN Use 2 drops to right eye as needed 07/16/19   Henderly, Britni A, PA-C  oxyCODONE-acetaminophen (PERCOCET/ROXICET) 5-325 MG tablet Take 1-2 tablets by mouth every 4 (four) hours as needed (Give 1 tab if pain <7/10.  Give 2 tab if pain >7.). 05/13/15   07/13/15, MD  sulfamethoxazole-trimethoprim (BACTRIM DS,SEPTRA DS) 800-160 MG tablet Take 1 tablet by mouth 2 (two) times daily. 07/02/17   Maczis, 09/01/17, PA-C    Allergies    Patient has no known allergies.  Review of Systems   Review of Systems   A complete review of systems was obtained and all systems are negative  except as noted in the HPI and PMH.   Physical Exam Updated Vital Signs BP 126/87 (BP Location: Right Arm)   Pulse 75   Temp 98 F (36.7 C) (Oral)   Resp 16   Ht 5\' 2"  (1.575 m)   Wt 103.9 kg   LMP 02/09/2020   SpO2 100%   BMI 41.88 kg/m   Physical Exam Vitals and nursing note reviewed.  Constitutional:      General: She is not in acute distress.    Appearance: She is well-developed and well-nourished. She is not diaphoretic.  HENT:     Head: Normocephalic and atraumatic.     Mouth/Throat:     Mouth: Oropharynx is clear and moist.  Eyes:     Extraocular Movements: EOM normal.     Comments: Right eyes injected, she is photosensitive, there is consensual photophobia, extraocular movement is intact without pain or diplopia.  She has some mild swelling to upper and lower lid.  No abnormal uptake on fluorescein stain.  Cardiovascular:     Rate and Rhythm: Normal rate and  regular rhythm.     Pulses: Intact distal pulses.  Pulmonary:     Effort: Pulmonary effort is normal.     Breath sounds: Normal breath sounds.  Abdominal:     Palpations: Abdomen is soft.     Tenderness: There is no abdominal tenderness.  Musculoskeletal:        General: Normal range of motion.     Cervical back: Normal range of motion.  Neurological:     Mental Status: She is alert and oriented to person, place, and time.  Psychiatric:        Mood and Affect: Mood and affect normal.     ED Results / Procedures / Treatments   Labs (all labs ordered are listed, but only abnormal results are displayed) Labs Reviewed - No data to display  EKG None  Radiology No results found.  Procedures Procedures (including critical care time)  Medications Ordered in ED Medications  tetracaine (PONTOCAINE) 0.5 % ophthalmic solution 2 drop (2 drops Right Eye Given 03/01/20 1556)  fluorescein ophthalmic strip 2 strip (2 strips Both Eyes Given 03/01/20 1556)    ED Course  I have reviewed the triage vital signs and the nursing notes.  Pertinent labs & imaging results that were available during my care of the patient were reviewed by me and considered in my medical decision making (see chart for details).    MDM Rules/Calculators/A&P                          Vitals:   03/01/20 1343 03/01/20 1344 03/01/20 1643  BP:  123/80 126/87  Pulse:  97 75  Resp:  18 16  Temp:  97.9 F (36.6 C) 98 F (36.7 C)  TempSrc:  Oral Oral  SpO2:  99% 100%  Weight: 103.9 kg    Height: 5\' 2"  (1.575 m)      Medications  tetracaine (PONTOCAINE) 0.5 % ophthalmic solution 2 drop (2 drops Right Eye Given 03/01/20 1556)  fluorescein ophthalmic strip 2 strip (2 strips Both Eyes Given 03/01/20 1556)    Victoria Christensen is 34 y.o. female presenting with right eye pain swelling foreign body sensation onset 2 days ago, patient states she has slightly blurred vision.  No abnormal uptake on fluorescein stain.   I am concerned about the photosensitivity, she may have iritis, I have consulted ophthalmologist on-call Dr.  Bevis who recommends she go immediately to the clinic to be evaluated, patient is comfortable driving to [facility] she will drive directly there to be seen by the specialist.    Final Clinical Impression(s) / ED Diagnoses Final diagnoses:  Iritis    Rx / DC Orders ED Discharge Orders    None       Lynetta Mare Mardella Layman 03/01/20 1806    Glynn Octave, MD 03/01/20 2051

## 2020-03-01 NOTE — Discharge Instructions (Addendum)
Go immediately to the ophthalmologist office at [address] that is in [place] to zip is [phone] the door may be locked she'll have to knock.  You  will see Dr. Vonna Kotyk tonight.

## 2021-08-02 ENCOUNTER — Encounter (HOSPITAL_BASED_OUTPATIENT_CLINIC_OR_DEPARTMENT_OTHER): Payer: Self-pay | Admitting: Emergency Medicine

## 2021-08-02 ENCOUNTER — Emergency Department (HOSPITAL_BASED_OUTPATIENT_CLINIC_OR_DEPARTMENT_OTHER)
Admission: EM | Admit: 2021-08-02 | Discharge: 2021-08-03 | Disposition: A | Discharge status: Home / Self Care | Payer: Medicaid Other | Attending: Emergency Medicine | Admitting: Emergency Medicine

## 2021-08-02 ENCOUNTER — Emergency Department (HOSPITAL_BASED_OUTPATIENT_CLINIC_OR_DEPARTMENT_OTHER): Payer: Medicaid Other

## 2021-08-02 ENCOUNTER — Other Ambulatory Visit: Payer: Self-pay

## 2021-08-02 DIAGNOSIS — O2340 Unspecified infection of urinary tract in pregnancy, unspecified trimester: Secondary | ICD-10-CM

## 2021-08-02 DIAGNOSIS — O2342 Unspecified infection of urinary tract in pregnancy, second trimester: Secondary | ICD-10-CM | POA: Insufficient documentation

## 2021-08-02 DIAGNOSIS — N189 Chronic kidney disease, unspecified: Secondary | ICD-10-CM | POA: Diagnosis not present

## 2021-08-02 DIAGNOSIS — R1030 Lower abdominal pain, unspecified: Secondary | ICD-10-CM

## 2021-08-02 DIAGNOSIS — R102 Pelvic and perineal pain: Secondary | ICD-10-CM | POA: Insufficient documentation

## 2021-08-02 DIAGNOSIS — Z3A15 15 weeks gestation of pregnancy: Secondary | ICD-10-CM | POA: Insufficient documentation

## 2021-08-02 LAB — COMPREHENSIVE METABOLIC PANEL
ALT: 13 U/L (ref 0–44)
AST: 15 U/L (ref 15–41)
Albumin: 3.4 g/dL — ABNORMAL LOW (ref 3.5–5.0)
Alkaline Phosphatase: 64 U/L (ref 38–126)
Anion gap: 8 (ref 5–15)
BUN: 7 mg/dL (ref 6–20)
CO2: 24 mmol/L (ref 22–32)
Calcium: 9.8 mg/dL (ref 8.9–10.3)
Chloride: 103 mmol/L (ref 98–111)
Creatinine, Ser: 0.61 mg/dL (ref 0.44–1.00)
GFR, Estimated: >60 mL/min (ref >60)
Glucose, Bld: 93 mg/dL (ref 70–99)
Potassium: 3.9 mmol/L (ref 3.5–5.1)
Sodium: 135 mmol/L (ref 135–145)
Total Bilirubin: 0.4 mg/dL (ref 0.3–1.2)
Total Protein: 7.7 g/dL (ref 6.5–8.1)

## 2021-08-02 LAB — CBC WITH DIFFERENTIAL/PLATELET
Abs Immature Granulocytes: 0.03 10*3/uL (ref 0.00–0.07)
Basophils Absolute: 0 10*3/uL (ref 0.0–0.1)
Basophils Relative: 0 %
Eosinophils Absolute: 0.2 10*3/uL (ref 0.0–0.5)
Eosinophils Relative: 2 %
HCT: 34.4 % — ABNORMAL LOW (ref 36.0–46.0)
Hemoglobin: 10.8 g/dL — ABNORMAL LOW (ref 12.0–15.0)
Immature Granulocytes: 0 %
Lymphocytes Relative: 16 %
Lymphs Abs: 1.8 10*3/uL (ref 0.7–4.0)
MCH: 24.8 pg — ABNORMAL LOW (ref 26.0–34.0)
MCHC: 31.4 g/dL (ref 30.0–36.0)
MCV: 78.9 fL — ABNORMAL LOW (ref 80.0–100.0)
Monocytes Absolute: 0.7 10*3/uL (ref 0.1–1.0)
Monocytes Relative: 6 %
Neutro Abs: 8.7 10*3/uL — ABNORMAL HIGH (ref 1.7–7.7)
Neutrophils Relative %: 76 %
Platelets: 192 10*3/uL (ref 150–400)
RBC: 4.36 MIL/uL (ref 3.87–5.11)
RDW: 16.1 % — ABNORMAL HIGH (ref 11.5–15.5)
WBC: 11.4 10*3/uL — ABNORMAL HIGH (ref 4.0–10.5)
nRBC: 0 % (ref 0.0–0.2)

## 2021-08-02 LAB — URINALYSIS, ROUTINE W REFLEX MICROSCOPIC
Bilirubin Urine: NEGATIVE
Glucose, UA: NEGATIVE mg/dL
Ketones, ur: 40 mg/dL — AB
Nitrite: NEGATIVE
Protein, ur: NEGATIVE mg/dL
Specific Gravity, Urine: 1.025 (ref 1.005–1.030)
pH: 6 (ref 5.0–8.0)

## 2021-08-02 LAB — URINALYSIS, MICROSCOPIC (REFLEX)

## 2021-08-02 MED ORDER — CEPHALEXIN 250 MG PO CAPS
500.0000 mg | ORAL_CAPSULE | Freq: Once | ORAL | Status: AC
  Administered 2021-08-02: 500 mg via ORAL
  Filled 2021-08-02: qty 2

## 2021-08-02 MED ORDER — ACETAMINOPHEN 500 MG PO TABS
500.0000 mg | ORAL_TABLET | Freq: Once | ORAL | Status: AC
  Administered 2021-08-02: 500 mg via ORAL
  Filled 2021-08-02: qty 1

## 2021-08-02 MED ORDER — SODIUM CHLORIDE 0.9 % IV BOLUS
1000.0000 mL | Freq: Once | INTRAVENOUS | Status: AC
  Administered 2021-08-02: 1000 mL via INTRAVENOUS

## 2021-08-02 MED ORDER — CEPHALEXIN 500 MG PO CAPS: 500.0000 mg | ORAL_CAPSULE | Freq: Four times a day (QID) | ORAL | 0 refills | Status: DC

## 2021-08-02 NOTE — ED Provider Notes (Signed)
[facility] EMERGENCY DEPARTMENT Provider Note   CSN: [id number] Arrival date & time: 08/02/21  1851     History  Chief Complaint  Patient presents with   Abdominal Pain    Victoria Christensen is a 36 y.o. female.   Abdominal Pain Associated symptoms: no chest pain and no dysuria   Patient is 14 and half weeks pregnant.  Has abdominal pain.  Somewhat crampy.  More on the lower abdomen and right side.  States she feels baby move.  No vaginal bleeding or discharge.  No dysuria.  Felt she needed to have a bowel movement.  Will come and go in last minute.  States pain got worse after eating.  Patient does have proven intrauterine pregnancy.   Past Medical History:  Diagnosis Date   Anemia    Chronic kidney disease    pyelonephritis   Former smoker    Gestational diabetes    insulin dependent   PCOS (polycystic ovarian syndrome) 2016    Home Medications Prior to Admission medications   Medication Sig Start Date End Date Taking? Authorizing Provider  cephALEXin (KEFLEX) 500 MG capsule Take 1 capsule (500 mg total) by mouth 4 (four) times daily. 08/02/21   Benjiman CorePickering, Aftan Vint, MD  ibuprofen (ADVIL,MOTRIN) 800 MG tablet Take 1 tablet (800 mg total) by mouth 3 (three) times daily. 12/29/16   Mackuen, Courteney Lyn, MD  metFORMIN (GLUCOPHAGE) 500 MG tablet Take 500 mg by mouth 2 (two) times daily with a meal. Reported on 04/17/2015    [provider]  Olopatadine HCl 0.2 % SOLN Use 2 drops to right eye as needed 07/16/19   Henderly, Britni A, PA-C  oxyCODONE-acetaminophen (PERCOCET/ROXICET) 5-325 MG tablet Take 1-2 tablets by mouth every 4 (four) hours as needed (Give 1 tab if pain <7/10.  Give 2 tab if pain >7.). 05/13/15   Beaulah DinningGambino, Christina M, MD  sulfamethoxazole-trimethoprim (BACTRIM DS,SEPTRA DS) 800-160 MG tablet Take 1 tablet by mouth 2 (two) times daily. 07/02/17   Maczis, Elmer SowMichael M, PA-C      Allergies    Patient has no known allergies.    Review of Systems    Review of Systems  Cardiovascular:  Negative for chest pain.  Gastrointestinal:  Positive for abdominal pain.  Genitourinary:  Negative for dysuria.  Neurological:  Negative for weakness.   Physical Exam Updated Vital Signs BP 129/72   Pulse 66   Temp 98.2 F (36.8 C) (Oral)   Resp 18   SpO2 98%  Physical Exam Vitals and nursing note reviewed.  Cardiovascular:     Rate and Rhythm: Normal rate.  Pulmonary:     Breath sounds: No wheezing.  Abdominal:     Tenderness: There is abdominal tenderness.     Comments: Gravid lower abdomen.  Tenderness to right lower and right upper quadrant.  No hernia palpated.  Skin:    General: Skin is warm.     Capillary Refill: Capillary refill takes less than 2 seconds.  Neurological:     Mental Status: She is alert.    ED Results / Procedures / Treatments   Labs (all labs ordered are listed, but only abnormal results are displayed) Labs Reviewed  COMPREHENSIVE METABOLIC PANEL - Abnormal; Notable for the following components:      Result Value   Albumin 3.4 (*)    All other components within normal limits  URINALYSIS, ROUTINE W REFLEX MICROSCOPIC - Abnormal; Notable for the following components:   APPearance HAZY (*)  Hgb urine dipstick MODERATE (*)    Ketones, ur 40 (*)    Leukocytes,Ua TRACE (*)    All other components within normal limits  CBC WITH DIFFERENTIAL/PLATELET - Abnormal; Notable for the following components:   WBC 11.4 (*)    Hemoglobin 10.8 (*)    HCT 34.4 (*)    MCV 78.9 (*)    MCH 24.8 (*)    RDW 16.1 (*)    Neutro Abs 8.7 (*)    All other components within normal limits  URINALYSIS, MICROSCOPIC (REFLEX) - Abnormal; Notable for the following components:   Bacteria, UA FEW (*)    All other components within normal limits  URINE CULTURE    EKG None  Radiology US OB Limited  Result Date: 08/02/2021 CLINICAL DATA:  Pelvic pain EXAM: LIMITED OBSTETRIC ULTRASOUND COMPARISON:  None Available. FINDINGS: Number  of Fetuses: 1 Heart Rate:  152 bpm Movement: Yes Presentation: Cephalic Placental Location: Posterior Previa: No Amniotic Fluid (Subjective):  Within normal limits. AFI:  cm BPD: 2.9 cm 15 w  1 d MATERNAL FINDINGS: Cervix:  Appears closed. Uterus/Adnexae: 4 cm anterior uterine fibroid. IMPRESSION: Proximally 15 week 1 day intrauterine pregnancy. Fetal heart rate 152 beats per minute. No acute maternal findings. This exam is performed on an emergent basis and does not comprehensively evaluate fetal size, dating, or anatomy; follow-up complete OB US should be considered if further fetal assessment is warranted. Electronically Signed   By: Charlett Nose M.D.   On: 08/02/2021 23:08   US Renal  Result Date: 08/02/2021 CLINICAL DATA:  Flank pain EXAM: RENAL / URINARY TRACT ULTRASOUND COMPLETE COMPARISON:  None Available. FINDINGS: Right Kidney: Renal measurements: 12.9 x 4.9 x 7.1 cm = volume: 232 mL. Echogenicity within normal limits. No mass or hydronephrosis visualized. Left Kidney: Renal measurements: 11.6 x 3.5 x 5.7 cm = volume: 194 mL. 6 mm echogenic focus in the midpole, likely nonobstructing stone. No hydronephrosis. Normal echotexture. Bladder: Appears normal for degree of bladder distention. Other: 2.7 cm gallstone seen within the gallbladder. IMPRESSION: No acute findings.  No hydronephrosis. Left nonobstructing nephrolithiasis. Cholelithiasis. Electronically Signed   By: Charlett Nose M.D.   On: 08/02/2021 23:07    Procedures Procedures    Medications Ordered in ED Medications  cephALEXin (KEFLEX) capsule 500 mg (has no administration in time range)  sodium chloride 0.9 % bolus 1,000 mL (1,000 mLs Intravenous New Bag/Given 08/02/21 2122)  acetaminophen (TYLENOL) tablet 500 mg (500 mg Oral Given 08/02/21 2128)    ED Course/ Medical Decision Making/ A&P                           Medical Decision Making Amount and/or Complexity of Data Reviewed Labs: ordered. Radiology: ordered.  Risk OTC  drugs. Prescription drug management.   Patient with abdominal pain.  Crampy.  Lower abdomen.  Is 14-1/[redacted] weeks pregnant.  No vaginal bleeding.  May have a little dysuria.  She still feels baby moving.  Pelvic exam did show no discharge or fluid although potentially cervix at fingertip.  Urine showed potential infection.  White count mildly elevated.  Does have gallstone but pain is in the lower abdomen.  Doubt this is the crampy pain.  Ultrasound also done to look for possible kidney stone.  No obstructing stone but does have left renal stone also likely not the cause.  Feels better after some IV fluids.  Did have some ketones in the urine potentially could  have some contractions due to some dehydration.  Will give antibiotics and follow-up with urology.  Cultures been sent.  Discharge home.  Appendicitis felt less likely.        Final Clinical Impression(s) / ED Diagnoses Final diagnoses:  Lower abdominal pain  Urinary tract infection in mother during pregnancy, antepartum    Rx / DC Orders ED Discharge Orders          Ordered    cephALEXin (KEFLEX) 500 MG capsule  4 times daily        08/02/21 2339              Benjiman Core, MD 08/02/21 2352

## 2021-08-02 NOTE — Discharge Instructions (Addendum)
Your urine looks as if there could be an infection.  You also have a gallstone but I think that is not likely causing the pain.  Urine cultures been sent you will be notified if the culture does not match up with the antibiotic.  Follow-up with Dr. Shawnie Pons for the pelvic pain.

## 2021-08-02 NOTE — ED Triage Notes (Signed)
14.5 wks preg, lower abdominal pain , reports feeling urgency to pass BM but it was not it , she said .  Denies vaginal bleeding . Reports felt baby moving today

## 2021-08-04 LAB — URINE CULTURE: Culture: <10000 — AB

## 2023-05-14 ENCOUNTER — Encounter (HOSPITAL_BASED_OUTPATIENT_CLINIC_OR_DEPARTMENT_OTHER): Payer: Self-pay | Admitting: Emergency Medicine

## 2023-05-14 ENCOUNTER — Other Ambulatory Visit: Payer: Self-pay

## 2023-05-14 DIAGNOSIS — Z7984 Long term (current) use of oral hypoglycemic drugs: Secondary | ICD-10-CM | POA: Insufficient documentation

## 2023-05-14 DIAGNOSIS — S0501XA Injury of conjunctiva and corneal abrasion without foreign body, right eye, initial encounter: Secondary | ICD-10-CM | POA: Insufficient documentation

## 2023-05-14 DIAGNOSIS — X58XXXA Exposure to other specified factors, initial encounter: Secondary | ICD-10-CM | POA: Insufficient documentation

## 2023-05-14 DIAGNOSIS — E119 Type 2 diabetes mellitus without complications: Secondary | ICD-10-CM | POA: Insufficient documentation

## 2023-05-14 NOTE — ED Triage Notes (Signed)
 Pt c/o pain and light sensitivity to RT eye since yesterday; no injury

## 2023-05-15 ENCOUNTER — Emergency Department (HOSPITAL_BASED_OUTPATIENT_CLINIC_OR_DEPARTMENT_OTHER)
Admission: EM | Admit: 2023-05-15 | Discharge: 2023-05-15 | Disposition: A | Discharge status: Home / Self Care | Payer: Self-pay | Attending: Emergency Medicine | Admitting: Emergency Medicine

## 2023-05-15 DIAGNOSIS — S0501XA Injury of conjunctiva and corneal abrasion without foreign body, right eye, initial encounter: Secondary | ICD-10-CM

## 2023-05-15 MED ORDER — ERYTHROMYCIN 5 MG/GM OP OINT
TOPICAL_OINTMENT | Freq: Once | OPHTHALMIC | Status: AC
  Administered 2023-05-15: 1 via OPHTHALMIC
  Filled 2023-05-15: qty 3.5

## 2023-05-15 MED ORDER — FLUORESCEIN SODIUM 1 MG OP STRP
1.0000 | ORAL_STRIP | Freq: Once | OPHTHALMIC | Status: AC
  Administered 2023-05-15: 1 via OPHTHALMIC
  Filled 2023-05-15: qty 1

## 2023-05-15 MED ORDER — TETRACAINE HCL 0.5 % OP SOLN
2.0000 [drp] | Freq: Once | OPHTHALMIC | Status: AC
  Administered 2023-05-15: 2 [drp] via OPHTHALMIC
  Filled 2023-05-15: qty 4

## 2023-05-15 MED ORDER — ERYTHROMYCIN 5 MG/GM OP OINT: TOPICAL_OINTMENT | Freq: Three times a day (TID) | OPHTHALMIC | Status: DC

## 2023-05-15 NOTE — ED Provider Notes (Signed)
 [place] EMERGENCY DEPARTMENT AT [facility] Provider Note   CSN: [id number] Arrival date & time: 05/14/23  2024     History  Chief Complaint  Patient presents with   Eye Problem    Victoria Christensen is a 38 y.o. female.  The history is provided by the patient.  Eye Problem Location:  Right eye Severity:  Moderate Onset quality:  Gradual Duration:  2 days Timing:  Constant Progression:  Worsening Chronicity:  New Context: not chemical exposure, not contact lens problem, not direct trauma, not foreign body and not scratch   Worsened by:  Nothing Associated symptoms: blurred vision and discharge   Associated symptoms: no double vision and no vomiting   Patient with history of diabetes presents with eye complaint.  Patient reports around 2 days ago she began having bilateral blurred vision, denies any trauma.  She does not wear contact lenses. She has had very mild headache and dizziness at times.  Over the past day most of the issues are in her right eye.  She reports swelling of the eyelid, drainage and blurred vision.  Minimal eye pain.  No fevers or vomiting.  She has never had eye surgery before.     Home Medications Prior to Admission medications   Medication Sig Start Date End Date Taking? Authorizing Provider  metFORMIN (GLUCOPHAGE) 500 MG tablet Take 500 mg by mouth 2 (two) times daily with a meal. Reported on 04/17/2015    [provider]  Olopatadine HCl 0.2 % SOLN Use 2 drops to right eye as needed 07/16/19   Henderly, Britni A, PA-C      Allergies    Patient has no known allergies.    Review of Systems   Review of Systems  Eyes:  Positive for blurred vision and discharge. Negative for double vision.  Gastrointestinal:  Negative for vomiting.    Physical Exam Updated Vital Signs BP 124/83   Pulse 77   Temp 98.3 F (36.8 C) (Oral)   Resp 16   Ht 1.6 m (5\' 3" )   Wt 96.6 kg   LMP 05/06/2023   SpO2 98%   BMI 37.73 kg/m  Physical  Exam CONSTITUTIONAL: Well developed/well nourished HEAD: Normocephalic/atraumatic EYES: EOMI/PERRL Mild swelling of the right eyelid.  No proptosis. No corneal haziness.  Photosensitivity is noted. Visual acuity noted, appropriate.  Central corneal abrasion noted OD.  No foreign bodies. No significant conjunctival erythema ENMT: Mucous membranes moist NECK: supple no meningeal signs CV: S1/S2 noted LUNGS: Lungs are clear to auscultation bilaterally, no apparent distress NEURO: Pt is awake/alert/appropriate, moves all extremitiesx4.  No facial droop.  No arm or leg drift. No past-pointing.  No ataxia No facial numbness  ED Results / Procedures / Treatments   Labs (all labs ordered are listed, but only abnormal results are displayed) Labs Reviewed - No data to display  EKG None  Radiology No results found.  Procedures Procedures    Medications Ordered in ED Medications  erythromycin ophthalmic ointment (has no administration in time range)  fluorescein ophthalmic strip 1 strip (1 strip Right Eye Given by Other 05/15/23 0124)  tetracaine (PONTOCAINE) 0.5 % ophthalmic solution 2 drop (2 drops Right Eye Given by Other 05/15/23 0124)    ED Course/ Medical Decision Making/ A&P                                 Medical Decision Making Risk  Prescription drug management.   Patient found to have corneal abrasion.  No foreign bodies.  No proptosis.  Visual acuity as noted.  Will place on topical antibiotics and refer to ophthalmology         Final Clinical Impression(s) / ED Diagnoses Final diagnoses:  Abrasion of right cornea, initial encounter    Rx / DC Orders ED Discharge Orders     None         Zadie Rhine, MD 05/15/23 [phone]

## 2023-09-25 ENCOUNTER — Other Ambulatory Visit: Payer: Self-pay

## 2023-09-25 ENCOUNTER — Emergency Department (HOSPITAL_BASED_OUTPATIENT_CLINIC_OR_DEPARTMENT_OTHER)
Admission: EM | Admit: 2023-09-25 | Discharge: 2023-09-25 | Disposition: A | Discharge status: Home / Self Care | Payer: Self-pay | Attending: Emergency Medicine | Admitting: Emergency Medicine

## 2023-09-25 ENCOUNTER — Encounter (HOSPITAL_BASED_OUTPATIENT_CLINIC_OR_DEPARTMENT_OTHER): Payer: Self-pay

## 2023-09-25 DIAGNOSIS — H16001 Unspecified corneal ulcer, right eye: Secondary | ICD-10-CM | POA: Insufficient documentation

## 2023-09-25 MED ORDER — MOXIFLOXACIN HCL 0.5 % OP SOLN: 1.0000 [drp] | Freq: Three times a day (TID) | OPHTHALMIC | 0 refills | Status: AC

## 2023-09-25 MED ORDER — FLUORESCEIN SODIUM 1 MG OP STRP
1.0000 | ORAL_STRIP | Freq: Once | OPHTHALMIC | Status: AC
  Administered 2023-09-25: 1 via OPHTHALMIC
  Filled 2023-09-25: qty 1

## 2023-09-25 MED ORDER — TETRACAINE HCL 0.5 % OP SOLN
2.0000 [drp] | Freq: Once | OPHTHALMIC | Status: AC
  Administered 2023-09-25: 2 [drp] via OPHTHALMIC
  Filled 2023-09-25: qty 4

## 2023-09-25 NOTE — ED Provider Notes (Signed)
  Physical Exam  BP 139/87 (BP Location: Left Arm)   Pulse 90   Temp 98 F (36.7 C) (Oral)   Resp 15   SpO2 98%   Physical Exam Vitals and nursing note reviewed.  HENT:     Head: Normocephalic and atraumatic.  Eyes:     Pupils: Pupils are equal, round, and reactive to light.  Cardiovascular:     Rate and Rhythm: Normal rate and regular rhythm.  Pulmonary:     Effort: Pulmonary effort is normal.     Breath sounds: Normal breath sounds.  Abdominal:     Palpations: Abdomen is soft.     Tenderness: There is no abdominal tenderness.  Skin:    General: Skin is warm and dry.  Neurological:     Mental Status: She is alert.  Psychiatric:        Mood and Affect: Mood normal.     Procedures  Procedures  ED Course / MDM   Clinical Course as of 09/25/23 1620  Thu Sep 25, 2023  1619 Discussed with Dr. Tobie (ophthalmology) who recommends starting patient on moxifloxacin  eyedrop and will follow-up with her in the office tomorrow.  She will call his office to confirm an appointment.  Appropriate discharge at this time [MP]    Clinical Course User Index [MP] Pamella Ozell LABOR, DO   Medical Decision Making I, Ozell Pamella DO, have assumed care of this patient from the previous provider pending ophthalmology consult given concern for corneal ulcer and disposition  Risk Prescription drug management.          Pamella Ozell LABOR, DO 09/25/23 1620

## 2023-09-25 NOTE — ED Notes (Addendum)
 2nd call for consult to [facility] MD  @ [phone] (Ophthalmology) for call back to [facility] MD @ [phone]

## 2023-09-25 NOTE — ED Provider Notes (Signed)
 [place] [facility] AT [facility] Provider Note   CSN: [id number] Arrival date & time: 09/25/23  1414     Patient presents with: Eye Pain   Victoria Christensen is a 38 y.o. female.    Eye Pain     Patient presents ED with complaints of right eye pain.  Patient states symptoms started about 4 days ago.  She has noticed redness and increasing tearing.  Patient denies any recent injuries.  She does not wear contact lenses.  Prior to Admission medications   Medication Sig Start Date End Date Taking? Authorizing Provider  metFORMIN  (GLUCOPHAGE ) 500 MG tablet Take 500 mg by mouth 2 (two) times daily with a meal. Reported on 04/17/2015    [provider]  Olopatadine  HCl 0.2 % SOLN Use 2 drops to right eye as needed 07/16/19   Henderly, Britni A, PA-C    Allergies: Patient has no known allergies.    Review of Systems  Eyes:  Positive for pain.    Updated Vital Signs BP 139/87 (BP Location: Left Arm)   Pulse 90   Temp 98 F (36.7 C) (Oral)   Resp 15   SpO2 98%   Physical Exam Vitals and nursing note reviewed.  Constitutional:      General: She is not in acute distress.    Appearance: She is well-developed.  HENT:     Head: Normocephalic and atraumatic.     Right Ear: External ear normal.     Left Ear: External ear normal.  Eyes:     General: No scleral icterus.       Right eye: No discharge.        Left eye: No discharge.     Conjunctiva/sclera: Conjunctivae normal.     Comments: Small approximately 1 to 2 mm area of fluorescein  uptake at about the 6 o'clock position of the cornea concerning for possible corneal ulcer  Neck:     Trachea: No tracheal deviation.  Cardiovascular:     Rate and Rhythm: Normal rate.  Pulmonary:     Effort: Pulmonary effort is normal. No respiratory distress.     Breath sounds: No stridor.  Abdominal:     General: There is no distension.  Musculoskeletal:        General: No swelling or deformity.      Cervical back: Neck supple.  Skin:    General: Skin is warm and dry.     Findings: No rash.  Neurological:     Mental Status: She is alert. Mental status is at baseline.     Cranial Nerves: No dysarthria or facial asymmetry.     Motor: No seizure activity.     (all labs ordered are listed, but only abnormal results are displayed) Labs Reviewed - No data to display  EKG: None  Radiology: No results found.   Procedures   Medications Ordered in the ED  tetracaine  (PONTOCAINE) 0.5 % ophthalmic solution 2 drop (2 drops Right Eye Given by Other 09/25/23 1451)  fluorescein  ophthalmic strip 1 strip (1 strip Right Eye Given by Other 09/25/23 1451)                                    Medical Decision Making Risk Prescription drug management.   Patient presents ED with complaints of eye pain blurred vision.  On exam patient appears to have signs of a corneal ulcer.  Her visual acuity is decreased but she is still able to read a chart that is on the wall.  Will consult with ophthalmology.  Anticipate discharge home with antibiotics and close outpatient follow-up.  Care turned over to Dr Pamella     Final diagnoses:  Corneal ulcer of right eye    ED Discharge Orders     None          Randol Simmonds, MD 09/25/23 1547

## 2023-09-25 NOTE — ED Notes (Signed)
 Visual acuity completed per order. Pt does not wear glasses or contacts. Is unable to see out of right eye to do eye exam said vision is blurry and her eye is swollen.

## 2023-09-25 NOTE — Discharge Instructions (Signed)
 You were seen in the emergency room for right eye pain Based on the examination of likely have a corneal ulcer of the right eye For this reason we prescribed you an antibiotic drop for you to pick up from your pharmacy Place 1 drop into the eye 3 times daily as directed It is important that you follow-up with an eye specialist tomorrow Please call the office of Dr. Tobie at the number listed above to confirm appointment for tomorrow as he will need to look at your eye and may need to refer you to a corneal specialist Return to the emergency department for acute worsening of your vision, severe pain or any other concerns

## 2023-09-25 NOTE — ED Triage Notes (Signed)
 Pt reports pain, swelling, drainage and redness to right eye X 4 days. Denies any other symptoms

## 2023-12-05 ENCOUNTER — Emergency Department (HOSPITAL_BASED_OUTPATIENT_CLINIC_OR_DEPARTMENT_OTHER)
Admission: EM | Admit: 2023-12-05 | Discharge: 2023-12-05 | Disposition: A | Discharge status: Home / Self Care | Payer: Self-pay | Attending: Emergency Medicine | Admitting: Emergency Medicine

## 2023-12-05 ENCOUNTER — Other Ambulatory Visit: Payer: Self-pay

## 2023-12-05 ENCOUNTER — Encounter (HOSPITAL_BASED_OUTPATIENT_CLINIC_OR_DEPARTMENT_OTHER): Payer: Self-pay | Admitting: Emergency Medicine

## 2023-12-05 DIAGNOSIS — H1031 Unspecified acute conjunctivitis, right eye: Secondary | ICD-10-CM | POA: Insufficient documentation

## 2023-12-05 MED ORDER — FLUORESCEIN SODIUM 1 MG OP STRP: 1.0000 | ORAL_STRIP | Freq: Once | OPHTHALMIC | Status: DC

## 2023-12-05 MED ORDER — TETRACAINE HCL 0.5 % OP SOLN: 2.0000 [drp] | Freq: Once | OPHTHALMIC | Status: DC

## 2023-12-05 NOTE — ED Triage Notes (Signed)
 Pt reports R eye swelling x 2 days. Denies known foreign body. + drainage.

## 2023-12-05 NOTE — Discharge Instructions (Addendum)
 Use over the  counter  Zadiator (ketotifen) or Pataday  eye drops for allegy relief. You may also use Refresh or Systane eyedrops for relief.  Follow up with the eye doctor for further evaluation  Contact a health care provider if: Your symptoms get worse or do not get better with treatment. You have mild eye pain. You become sensitive to light. You have spots or blisters on your eyes. You have a fever. Get help right away if: You have redness, swelling, or other symptoms in only one eye. Your vision is blurred or you have other vision changes. You have pus draining from your eyes. You have severe eye pain.

## 2023-12-05 NOTE — ED Provider Notes (Signed)
 [place] [facility]GENCY DEPARTMENT AT [facility] Provider Note   CSN: [id number] Arrival date & time: 12/05/23  1545     Patient presents with: Facial Swelling   Victoria Christensen is a 38 y.o. female presents emergency department with a chief complaint of right eye discomfort.  Patient states that this is the third time that she has had recurrent episodes of eye swelling redness and discomfort.  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  She states that she seems to feel like there is dust in her eyes and paper particle that comes off of the mail line.  She frequently feels like her eyes are burning by the end of the day.  She also feels like her eyes are dry.  She has been seen twice for the same she currently has in her pocket fresh Vigamox  eyedrops that are unexpired from her last visit and begin using them a couple days ago and has improvement in her eye discomfort.  She denies photosensitivity, blurriness, itching, discharge.  She noticed that her eye is swollen and red and she has some burning discomfort in the eye.  She does not wear contact lenses and she has been wearing protective eye Covering at work.   HPI     Prior to Admission medications   Medication Sig Start Date End Date Taking? Authorizing Provider  metFORMIN  (GLUCOPHAGE ) 500 MG tablet Take 500 mg by mouth 2 (two) times daily with a meal. Reported on 04/17/2015    [provider]  moxifloxacin  (VIGAMOX ) 0.5 % ophthalmic solution Place 1 drop into the right eye 3 (three) times daily. 09/25/23   Pamella Ozell LABOR, DO  Olopatadine  HCl 0.2 % SOLN Use 2 drops to right eye as needed 07/16/19   Henderly, Britni A, PA-C    Allergies: Patient has no known allergies.    Review of Systems  Updated Vital Signs BP 123/87   Pulse 100   Temp 99 F (37.2 C)   Resp 15   Ht 5' 3 (1.6 m)   LMP 10/09/2023 (Exact Date)   SpO2 100%   Breastfeeding No   BMI 37.73  kg/m   Physical Exam Vitals and nursing note reviewed.  Constitutional:      General: She is not in acute distress.    Appearance: She is well-developed. She is not diaphoretic.  HENT:     Head: Normocephalic and atraumatic.     Right Ear: External ear normal.     Left Ear: External ear normal.     Nose: Nose normal.     Mouth/Throat:     Mouth: Mucous membranes are moist.  Eyes:     General: No scleral icterus.    Extraocular Movements: Extraocular movements intact.     Conjunctiva/sclera: Conjunctivae normal.     Pupils: Pupils are equal, round, and reactive to light.     Comments: The right eye lid is swollen on the upper lid.  There is chemosis laterally.  No evidence of blocked duct in the eye.  She has erythema of the right conjunctiva.  No discharge.  No photophobia.  Cardiovascular:     Rate and Rhythm: Normal rate and regular rhythm.     Heart sounds: Normal heart sounds. No murmur heard.    No friction rub. No gallop.  Pulmonary:     Effort: Pulmonary effort is normal. No respiratory distress.     Breath sounds: Normal  breath sounds.  Abdominal:     General: Bowel sounds are normal. There is no distension.     Palpations: Abdomen is soft. There is no mass.     Tenderness: There is no abdominal tenderness. There is no guarding.  Musculoskeletal:     Cervical back: Normal range of motion.  Skin:    General: Skin is warm and dry.  Neurological:     Mental Status: She is alert and oriented to person, place, and time.  Psychiatric:        Behavior: Behavior normal.     (all labs ordered are listed, but only abnormal results are displayed) Labs Reviewed - No data to display  EKG: None  Radiology: No results found.   Procedures   Medications Ordered in the ED  tetracaine  (PONTOCAINE) 0.5 % ophthalmic solution 2 drop (has no administration in time range)  fluorescein  ophthalmic strip 1 strip (has no administration in time range)                                     Medical Decision Making Risk Prescription drug management.   Patient with recurrent episodes of conjunctivitis.  This may be irritant conjunctivitis chemical or allergic conjunctivitis.  She may also have an underlying autoimmune process such as Sjogren syndrome.  She does not have any evidence of an acute ulceration or injury to the eye.  She does not wear contact lenses.  Her vision is intact.  She may continue with the Vigamox  drops.  Will add allergy drops and lubricating eyedrops.  Discussed outpatient follow-up and return precautions.  Appropriate for discharge at time     Final diagnoses:  None    ED Discharge Orders     None          Arloa Chroman, PA-C 12/05/23 2039    Geraldene Hamilton, MD 12/08/23 1750

## 2023-12-05 NOTE — ED Notes (Signed)
 Tetracaine  and Fluorescein  strip no given per assigned provider.

## 2024-05-04 IMAGING — US US RENAL
1 series · 14 of 25 positions shown · non-contrast
Comparison: None Available.

CLINICAL DATA: Flank pain

EXAM:
RENAL / URINARY TRACT ULTRASOUND COMPLETE

[Series 1: us renal · 14 of 61 slices shown]
[im 1/61]
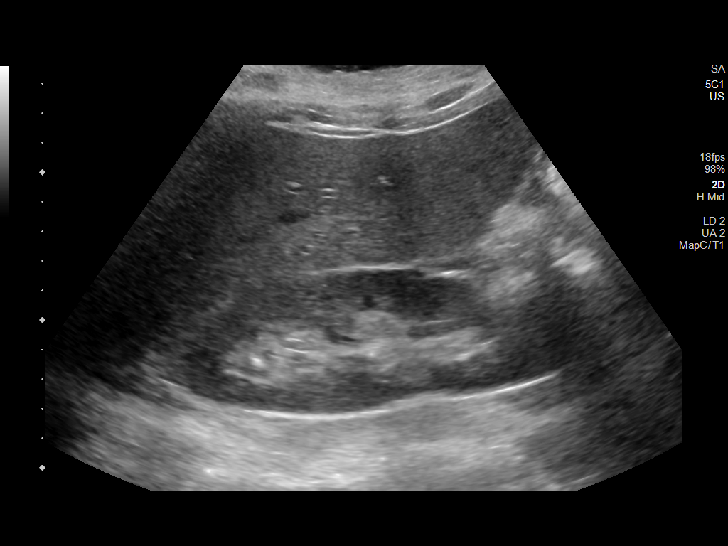
[im 6/61]
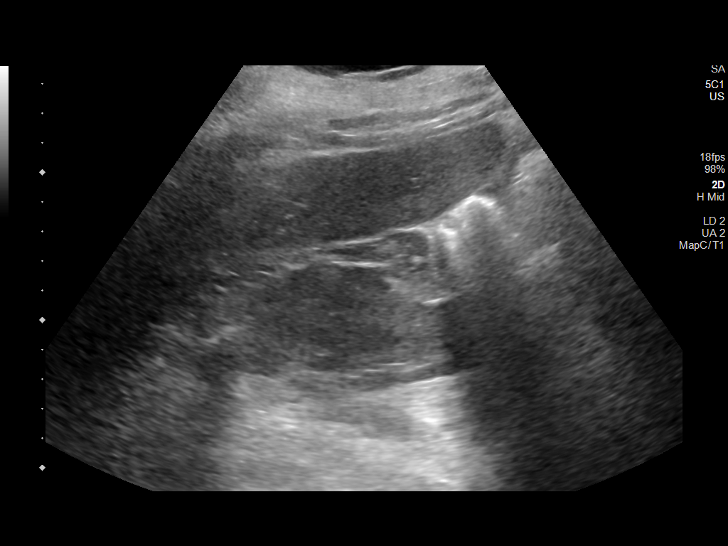
[im 11/61]
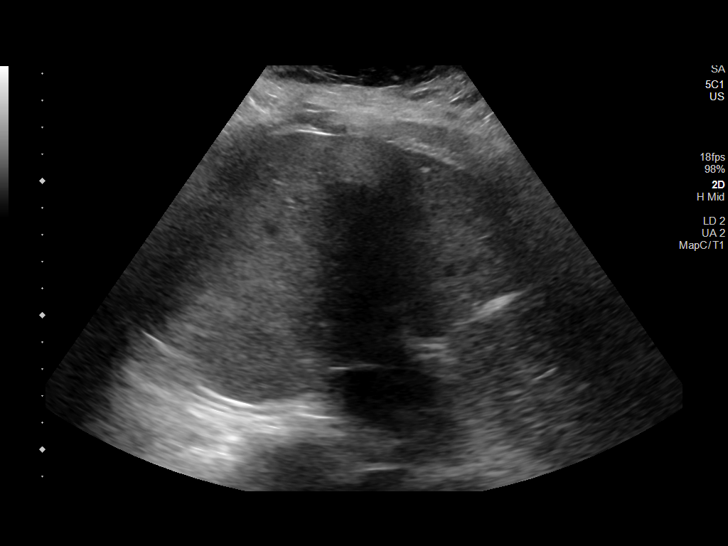
[im 16/61]
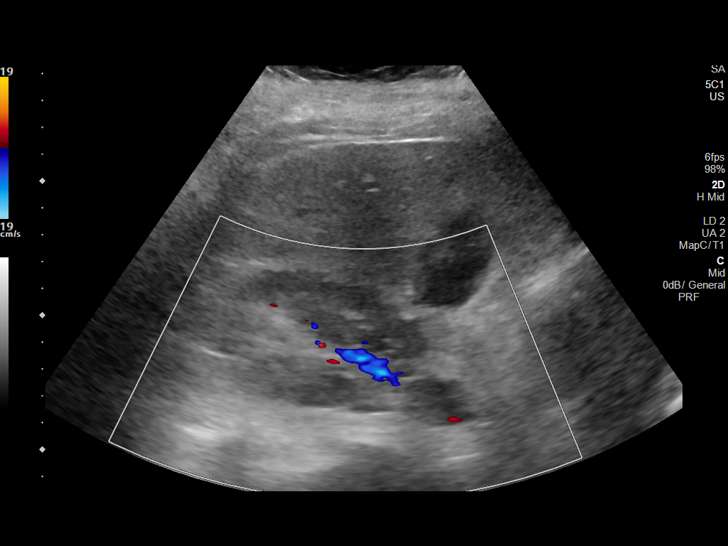
[im 21/61]
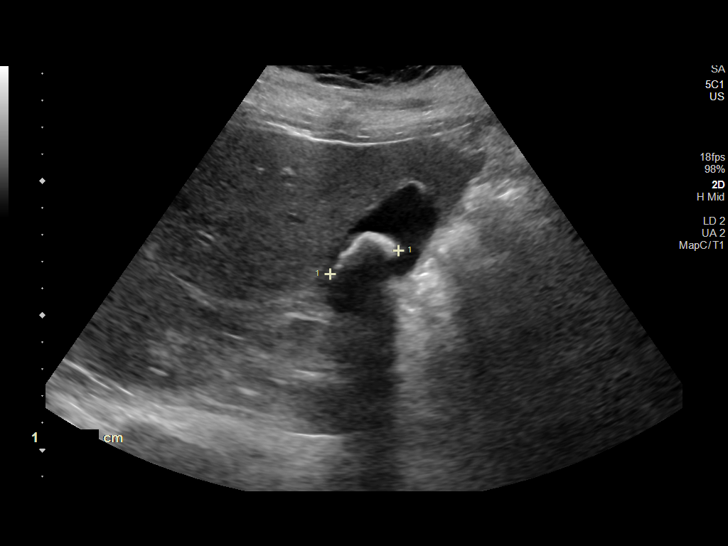
[im 23/61]
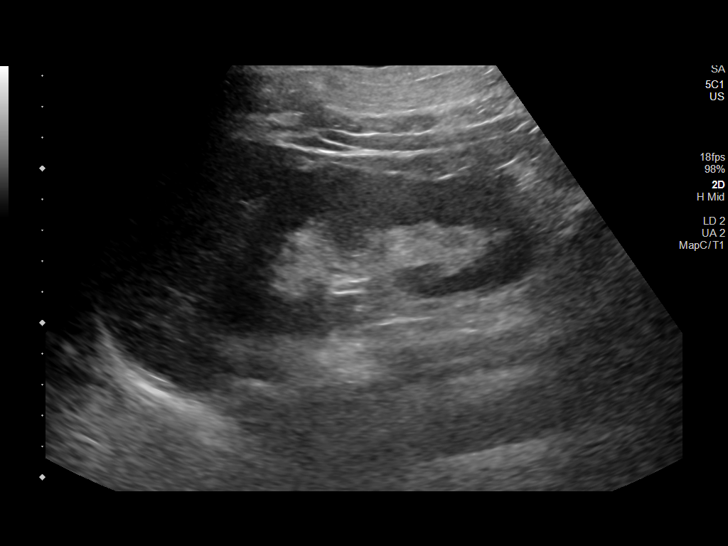
[im 28/61]
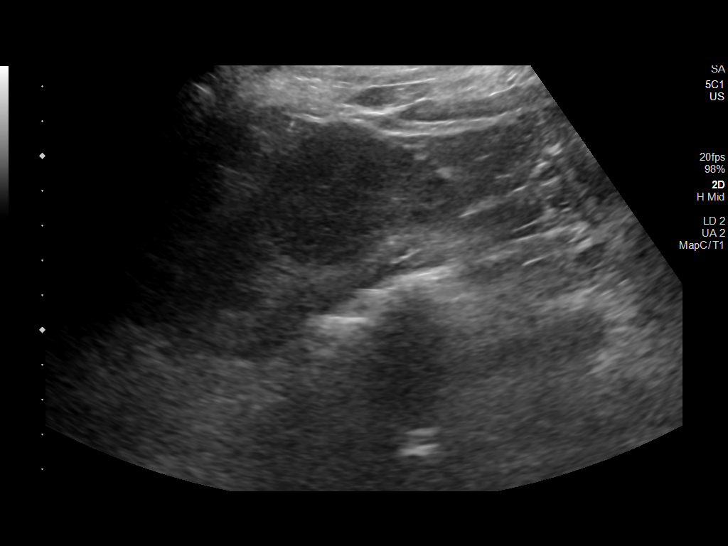
[im 33/61]
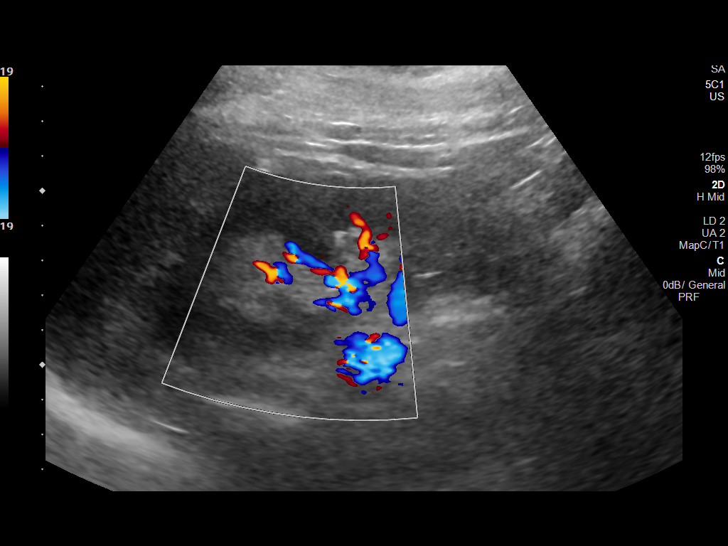
[im 38/61]
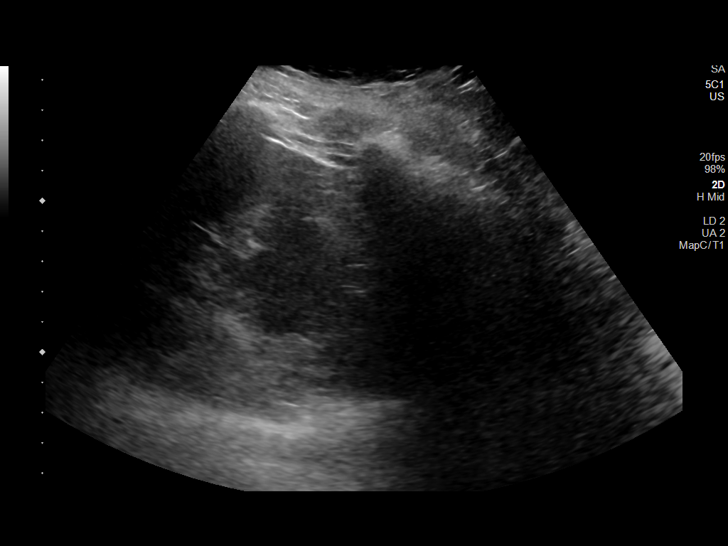
[im 41/61]
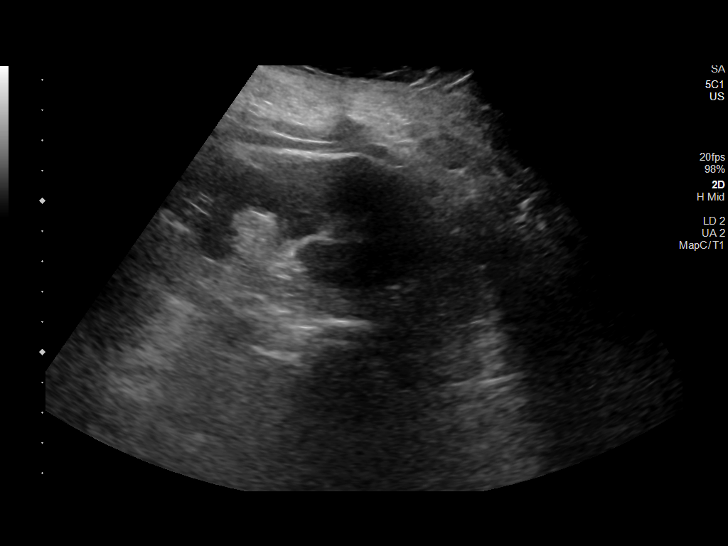
[im 46/61]
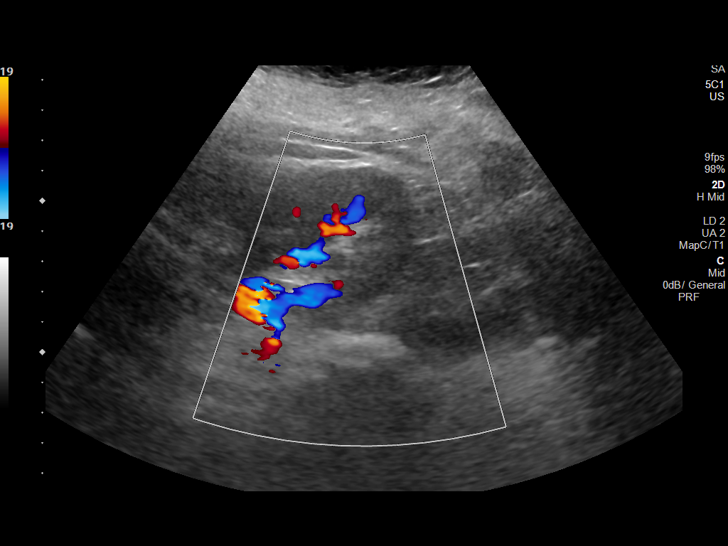
[im 51/61]
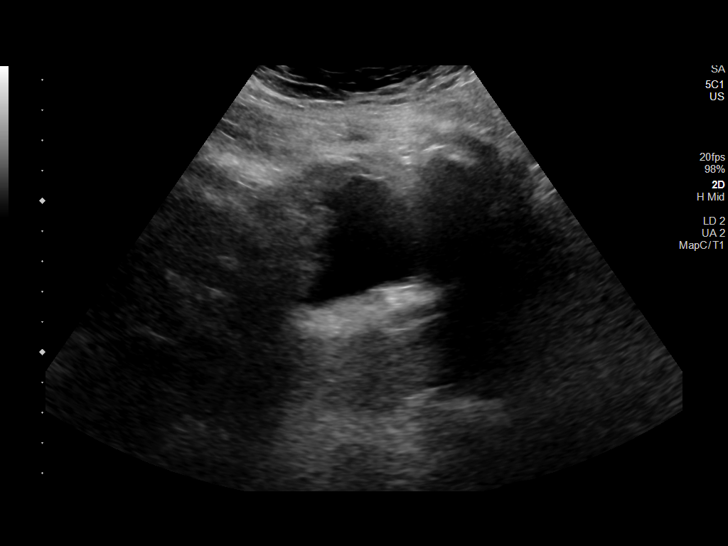
[im 56/61]
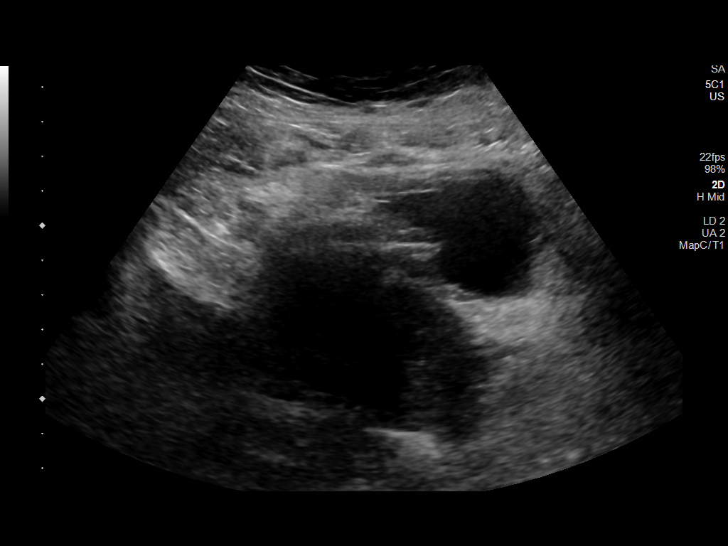
[im 61/61]
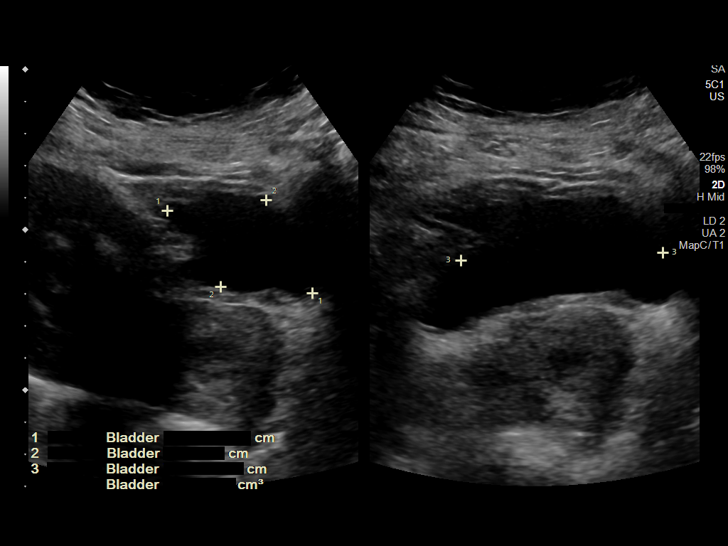

[14 of 25 positions shown; findings below may reference images not displayed]

FINDINGS: Right Kidney:

Renal measurements: 12.9 x 4.9 x 7.1 cm = volume: 232 mL.
Echogenicity within normal limits. No mass or hydronephrosis
visualized.

Left Kidney:

Renal measurements: 11.6 x 3.5 x 5.7 cm = volume: 194 mL. 6 mm
echogenic focus in the midpole, likely nonobstructing stone. No
hydronephrosis. Normal echotexture.

Bladder:

Appears normal for degree of bladder distention.

Other:

2.7 cm gallstone seen within the gallbladder.
IMPRESSION: No acute findings.  No hydronephrosis.

Left nonobstructing nephrolithiasis.

Cholelithiasis.
